# Patient Record
Sex: Male | Born: 1970 | Race: White | Hispanic: No | Marital: Married | Smoking: Never smoker
Health system: Southern US, Community
[De-identification: ages and names within clinical notes are randomized; demographics above are authoritative.]

## PROBLEM LIST (undated history)

## (undated) DIAGNOSIS — M1990 Unspecified osteoarthritis, unspecified site: Secondary | ICD-10-CM

## (undated) DIAGNOSIS — F419 Anxiety disorder, unspecified: Secondary | ICD-10-CM

## (undated) DIAGNOSIS — K219 Gastro-esophageal reflux disease without esophagitis: Secondary | ICD-10-CM

## (undated) DIAGNOSIS — Z9889 Other specified postprocedural states: Secondary | ICD-10-CM

## (undated) DIAGNOSIS — R112 Nausea with vomiting, unspecified: Secondary | ICD-10-CM

## (undated) DIAGNOSIS — E669 Obesity, unspecified: Secondary | ICD-10-CM

## (undated) HISTORY — PX: VASECTOMY: SHX75

---
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
## 1992-07-06 HISTORY — PX: CHOLECYSTECTOMY: SHX55

## 2013-11-29 NOTE — Progress Notes (Signed)
Please enter orders in EPIC as patient has pre-op appointment on 12/01/13 at 9 am! Thank you!

## 2013-11-30 ENCOUNTER — Other Ambulatory Visit: Payer: Self-pay | Admitting: Orthopedic Surgery

## 2013-11-30 ENCOUNTER — Encounter (HOSPITAL_COMMUNITY): Payer: Self-pay | Admitting: Pharmacy Technician

## 2013-11-30 ENCOUNTER — Other Ambulatory Visit (HOSPITAL_COMMUNITY): Payer: Self-pay | Admitting: Specialist

## 2013-11-30 NOTE — Patient Instructions (Addendum)
[place]  11/30/2013   Your procedure is scheduled on: Wednesday June 3rd, 2015  Report to [facility] Main Entrance and follow signs to  [place] at 900 AM.  Call this number if you have problems the morning of surgery [phone]   Remember:  Do not eat food or drink liquids :After Midnight.     Take these medicines the morning of surgery with A SIP OF WATER: lexapro                               You may not have any metal on your body including hair pins and piercings  Do not wear jewelry, make-up, lotions, powders, or deodorant.   Men may shave face and neck.  Do not bring valuables to the hospital. [place].  Contacts, dentures or bridgework may not be worn into surgery.  Leave suitcase in the car. After surgery it may be brought to your room.  For patients admitted to the hospital, checkout time is 11:00 AM the day of discharge. ____________________________________________  [facility] - Preparing for Surgery Before surgery, you can play an important role.  Because skin is not sterile, your skin needs to be as free of germs as possible.  You can reduce the number of germs on your skin by washing with CHG (chlorahexidine gluconate) soap before surgery.  CHG is an antiseptic cleaner which kills germs and bonds with the skin to continue killing germs even after washing. Please DO NOT use if you have an allergy to CHG or antibacterial soaps.  If your skin becomes reddened/irritated stop using the CHG and inform your nurse when you arrive at Short Stay. Do not shave (including legs and underarms) for at least 48 hours prior to the first CHG shower.  You may shave your face/neck. Please follow these instructions carefully:  1.  Shower with CHG Soap the night before surgery and the  morning of Surgery.  2.  If you choose to wash your hair, wash your hair first as usual with your  normal  shampoo.  3.  After you shampoo, rinse  your hair and body thoroughly to remove the  shampoo.                            4.  Use CHG as you would any other liquid soap.  You can apply chg directly  to the skin and wash                       Gently with a scrungie or clean washcloth.  5.  Apply the CHG Soap to your body ONLY FROM THE NECK DOWN.   Do not use on face/ open                           Wound or open sores. Avoid contact with eyes, ears mouth and genitals (private parts).                       Wash face,  Genitals (private parts) with your normal soap.             6.  Wash thoroughly, paying special attention to the area where your surgery  will be performed.  7.  Thoroughly rinse your  body with warm water from the neck down.  8.  DO NOT shower/wash with your normal soap after using and rinsing off  the CHG Soap.                9.  Pat yourself dry with a clean towel.            10.  Wear clean pajamas.            11.  Place clean sheets on your bed the night of your first shower and do not  sleep with pets. Day of Surgery : Do not apply any lotions/deodorants the morning of surgery.  Please wear clean clothes to the hospital/surgery center.  FAILURE TO FOLLOW THESE INSTRUCTIONS MAY RESULT IN THE CANCELLATION OF YOUR SURGERY PATIENT SIGNATURE_________________________________  NURSE SIGNATURE__________________________________  ________________________________________________________________________   Adam Phenix  An incentive spirometer is a tool that can help keep your lungs clear and active. This tool measures how well you are filling your lungs with each breath. Taking long deep breaths may help reverse or decrease the chance of developing breathing (pulmonary) problems (especially infection) following:  A long period of time when you are unable to move or be active. BEFORE THE PROCEDURE   If the spirometer includes an indicator to show your best effort, your nurse or respiratory therapist will set it to a  desired goal.  If possible, sit up straight or lean slightly forward. Try not to slouch.  Hold the incentive spirometer in an upright position. INSTRUCTIONS FOR USE  1. Sit on the edge of your bed if possible, or sit up as far as you can in bed or on a chair. 2. Hold the incentive spirometer in an upright position. 3. Breathe out normally. 4. Place the mouthpiece in your mouth and seal your lips tightly around it. 5. Breathe in slowly and as deeply as possible, raising the piston or the ball toward the top of the column. 6. Hold your breath for 3-5 seconds or for as long as possible. Allow the piston or ball to fall to the bottom of the column. 7. Remove the mouthpiece from your mouth and breathe out normally. 8. Rest for a few seconds and repeat Steps 1 through 7 at least 10 times every 1-2 hours when you are awake. Take your time and take a few normal breaths between deep breaths. 9. The spirometer may include an indicator to show your best effort. Use the indicator as a goal to work toward during each repetition. 10. After each set of 10 deep breaths, practice coughing to be sure your lungs are clear. If you have an incision (the cut made at the time of surgery), support your incision when coughing by placing a pillow or rolled up towels firmly against it. Once you are able to get out of bed, walk around indoors and cough well. You may stop using the incentive spirometer when instructed by your caregiver.  RISKS AND COMPLICATIONS  Take your time so you do not get dizzy or light-headed.  If you are in pain, you may need to take or ask for pain medication before doing incentive spirometry. It is harder to take a deep breath if you are having pain. AFTER USE  Rest and breathe slowly and easily.  It can be helpful to keep track of a log of your progress. Your caregiver can provide you with a simple table to help with this. If you are using the spirometer at home, follow  these  instructions: SEEK MEDICAL CARE IF:   You are having difficultly using the spirometer.  You have trouble using the spirometer as often as instructed.  Your pain medication is not giving enough relief while using the spirometer.  You develop fever of 100.5 F (38.1 C) or higher. SEEK IMMEDIATE MEDICAL CARE IF:   You cough up bloody sputum that had not been present before.  You develop fever of 102 F (38.9 C) or greater.  You develop worsening pain at or near the incision site. MAKE SURE YOU:   Understand these instructions.  Will watch your condition.  Will get help right away if you are not doing well or get worse. Document Released: 11/02/2006 Document Revised: 09/14/2011 Document Reviewed: 01/03/2007 [facility] Patient Information 2014 [place], [place].   ________________________________________________________________________

## 2013-12-01 ENCOUNTER — Ambulatory Visit (HOSPITAL_COMMUNITY)
Admission: RE | Admit: 2013-12-01 | Discharge: 2013-12-01 | Disposition: A | Discharge status: Home / Self Care | Payer: Managed Care, Other (non HMO) | Source: Ambulatory Visit | Attending: Orthopedic Surgery | Admitting: Orthopedic Surgery

## 2013-12-01 ENCOUNTER — Encounter (HOSPITAL_COMMUNITY)
Admission: RE | Admit: 2013-12-01 | Discharge: 2013-12-01 | Disposition: A | Discharge status: Home / Self Care | Payer: Managed Care, Other (non HMO) | Source: Ambulatory Visit | Attending: Specialist | Admitting: Specialist

## 2013-12-01 ENCOUNTER — Encounter (INDEPENDENT_AMBULATORY_CARE_PROVIDER_SITE_OTHER): Payer: Self-pay

## 2013-12-01 ENCOUNTER — Encounter (HOSPITAL_COMMUNITY): Payer: Self-pay

## 2013-12-01 DIAGNOSIS — Z01812 Encounter for preprocedural laboratory examination: Secondary | ICD-10-CM | POA: Insufficient documentation

## 2013-12-01 DIAGNOSIS — Z01818 Encounter for other preprocedural examination: Secondary | ICD-10-CM | POA: Insufficient documentation

## 2013-12-01 HISTORY — DX: Nausea with vomiting, unspecified: R11.2

## 2013-12-01 HISTORY — DX: Gastro-esophageal reflux disease without esophagitis: K21.9

## 2013-12-01 HISTORY — DX: Nausea with vomiting, unspecified: Z98.890

## 2013-12-01 HISTORY — DX: Anxiety disorder, unspecified: F41.9

## 2013-12-01 LAB — CBC
HEMATOCRIT: 48.9 % (ref 39.0–52.0)
HEMOGLOBIN: 16.7 g/dL (ref 13.0–17.0)
MCH: 31.7 pg (ref 26.0–34.0)
MCHC: 34.2 g/dL (ref 30.0–36.0)
MCV: 92.8 fL (ref 78.0–100.0)
Platelets: 249 10*3/uL (ref 150–400)
RBC: 5.27 MIL/uL (ref 4.22–5.81)
RDW: 12.8 % (ref 11.5–15.5)
WBC: 8.9 10*3/uL (ref 4.0–10.5)

## 2013-12-01 LAB — BASIC METABOLIC PANEL
BUN: 17 mg/dL (ref 6–23)
CO2: 28 mEq/L (ref 19–32)
Calcium: 9.6 mg/dL (ref 8.4–10.5)
Chloride: 101 mEq/L (ref 96–112)
Creatinine, Ser: 0.76 mg/dL (ref 0.50–1.35)
GFR calc non Af Amer: >90 mL/min (ref >90)
GLUCOSE: 82 mg/dL (ref 70–99)
POTASSIUM: 4.7 meq/L (ref 3.7–5.3)
SODIUM: 138 meq/L (ref 137–147)

## 2013-12-01 LAB — SURGICAL PCR SCREEN
MRSA, PCR: POSITIVE — AB
Staphylococcus aureus: POSITIVE — AB

## 2013-12-01 NOTE — Progress Notes (Signed)
Prescription for Mupirocin 22 gram tube called into the [facility] pharmacy in [place]. Pt called and made aware. Pt has no questions or concerns

## 2013-12-04 ENCOUNTER — Other Ambulatory Visit: Payer: Self-pay | Admitting: Orthopedic Surgery

## 2013-12-04 NOTE — H&P (Signed)
Darren Woods is an 43 y.o. male.   Chief Complaint: back and leg pain HPI: The patient is a 43 year old male who presents today for follow up of their back. The patient is being followed for their low back symptoms. They are now year(s) out from when symptoms initially began, worsening over the past 6 weeks. Symptoms reported today include: pain, weakness, numbness, burning and leg pain (right). The patient states that they are doing poorly. Current treatment includes: relative rest, activity modification and pain medications. The following medication has been used for pain control: Norco. The patient presents today following MRI. Note for "Follow-up back": The patient reports new symptoms of burning and pain in the saddle area. He states that he has not lost control of his bladder, but he does not feel the urge to urinate. The patient is currently out of work.  Right lower extremity radicular pain. Follows up with his MRI indicating disc herniation at L5-S1 compressing the S1 nerve root. He reports persistent numbness, weakness and pain into the leg not helped with the cortisone.  Past Medical History  Diagnosis Date  . Anxiety   . GERD (gastroesophageal reflux disease)     occasional  . PONV (postoperative nausea and vomiting)     with first knee surgery    Past Surgical History  Procedure Laterality Date  . Cholecystectomy  1994  . Knee arthroscopy Bilateral 1989    No family history on file. Social History:  reports that he has never smoked. He has never used smokeless tobacco. He reports that he drinks alcohol. He reports that he does not use illicit drugs.  Allergies: No Known Allergies   (Not in a hospital admission)  No results found for this or any previous visit (from the past 48 hour(s)). No results found.  Review of Systems  Constitutional: Negative.   HENT: Negative.   Eyes: Negative.   Respiratory: Negative.   Cardiovascular: Negative.   Gastrointestinal:  Negative.   Genitourinary: Negative.   Musculoskeletal: Positive for back pain.  Skin: Negative.   Neurological: Positive for sensory change and focal weakness.    There were no vitals taken for this visit. Physical Exam  Constitutional: He is oriented to person, place, and time. He appears well-developed and well-nourished.  HENT:  Head: Normocephalic and atraumatic.  Eyes: Conjunctivae and EOM are normal. Pupils are equal, round, and reactive to light.  Neck: Normal range of motion. Neck supple.  Cardiovascular: Normal rate and regular rhythm.   Respiratory: Effort normal and breath sounds normal.  GI: Soft. Bowel sounds are normal.  Musculoskeletal:  On exam he is in moderate distress. Mood and affect appropriate. He walks with an antalgic gait. Straight leg raise on the right produces buttock, thigh and calf pain exacerbated with dorsal augmentation maneuver. He has diminished repetitive plantar flexion, decreased sensation in the S1 dermatome, absent Achilles reflex. Pain with extension and end flexion of the lumbar spine. He may have some slight hip flexor weakness.  No evidence of soft tissue swelling, deformity or skin ecchymosis. On palpation there is no tenderness of the lumbar spine. No flank pain with percussion. The abdomen is soft and nontender. Nontender over the trochanters. No cellulitis or lymphadenopathy.  Motor is 5/5 including EHL, tibialis anterior, plantar flexion, quadriceps and hamstrings. Patient is normoreflexic. There is no Babinski or clonus. patient has good distal pulses. No DVT. No pain and normal range of motion without instability of the hips, knees and ankles.  Neurological: He is alert and oriented to person, place, and time. He has normal reflexes.  Skin: Skin is warm and dry.  Psychiatric: He has a normal mood and affect.    Imaging He does have a disc herniation paracentral to the right compressing the S1 nerve root. At L2-3 he has a significant  epidural fat that is compressive the thecal sac. It was read as moderate however the AP diameter of the thecal sac in the right lateral recess is actually 2.6 in millimeters and essentially the 3 mm which would be consistent with severe stenosis.  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 Clearly I feel with the myotomal weakness and dermatomal dysesthesias decompression of L5-S1 is appropriate.  I had an extensive discussion of the risks and benefits of the lumbar decompression with the patient including bleeding, infection, damage to neurovascular structures, epidural fibrosis, CSF leak requiring repair. We also discussed increase in pain, adjacent segment disease, recurrent disc herniation, need for future surgery including repeat decompression and/or fusion. We also discussed risks of postoperative hematoma, paralysis, anesthetic complications including DVT, PE, death, cardiopulmonary dysfunction. In addition, the perioperative and postoperative courses were discussed in detail including the rehabilitative time and return to functional activity and work. I provided the patient with an illustrated handout and utilized the appropriate surgical models.  Hopefully we will get resolution of his symptoms. We discussed permanent symptoms, recurrent disc herniation, progressive disc degeneration. He does have intermittent back pain from his disc degeneration at L2-3, L3-4 and L4-5 that he will have ongoing back pain. We discussed the possibility of decompressing at L3-4. I do feel this is stenosis that is fairly significant. He reports just prior to the event of this he would have some problems where he would be ambulating at a track or out and his legs will get weak down the front of his thigh. It has been progressive over the years. This would be symptoms separate from his acute disc herniation. I reviewed the MRI findings. 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 Given that he has had symptoms that are claudication type symptoms in the presence of that lesion it would be reasonable to decompress L2-3 centrally given that symptomatology. We will have him scheduled for that procedure.  Plan microlumbar decompression L2-3, L5-S1  Anylah Scheib M. Asante Blanda for Dr. Tonita Cong 12/04/2013, 8:34 AM

## 2013-12-05 MED ORDER — VANCOMYCIN HCL 10 G IV SOLR
1500.0000 mg | INTRAVENOUS | Status: AC
  Administered 2013-12-06: 1500 mg via INTRAVENOUS
  Filled 2013-12-05 (×2): qty 1500

## 2013-12-05 MED ORDER — DEXTROSE 5 % IV SOLN
3.0000 g | INTRAVENOUS | Status: AC
  Administered 2013-12-06: 3 g via INTRAVENOUS
  Filled 2013-12-05: qty 3000

## 2013-12-06 ENCOUNTER — Ambulatory Visit (HOSPITAL_COMMUNITY): Payer: Managed Care, Other (non HMO)

## 2013-12-06 ENCOUNTER — Encounter (HOSPITAL_COMMUNITY): Payer: Managed Care, Other (non HMO) | Admitting: Registered Nurse

## 2013-12-06 ENCOUNTER — Ambulatory Visit (HOSPITAL_COMMUNITY)
Admission: RE | Admit: 2013-12-06 | Discharge: 2013-12-07 | Disposition: A | Discharge status: Home / Self Care | Payer: Managed Care, Other (non HMO) | Source: Ambulatory Visit | Attending: Specialist | Admitting: Specialist

## 2013-12-06 ENCOUNTER — Encounter (HOSPITAL_COMMUNITY)
Admission: RE | Disposition: A | Discharge status: Home / Self Care | Payer: Self-pay | Source: Ambulatory Visit | Attending: Specialist

## 2013-12-06 ENCOUNTER — Ambulatory Visit (HOSPITAL_COMMUNITY): Payer: Managed Care, Other (non HMO) | Admitting: Registered Nurse

## 2013-12-06 ENCOUNTER — Encounter (HOSPITAL_COMMUNITY): Payer: Self-pay | Admitting: *Deleted

## 2013-12-06 DIAGNOSIS — D1779 Benign lipomatous neoplasm of other sites: Secondary | ICD-10-CM | POA: Insufficient documentation

## 2013-12-06 DIAGNOSIS — Z6838 Body mass index (BMI) 38.0-38.9, adult: Secondary | ICD-10-CM | POA: Insufficient documentation

## 2013-12-06 DIAGNOSIS — M5126 Other intervertebral disc displacement, lumbar region: Secondary | ICD-10-CM | POA: Insufficient documentation

## 2013-12-06 DIAGNOSIS — Z9089 Acquired absence of other organs: Secondary | ICD-10-CM | POA: Insufficient documentation

## 2013-12-06 DIAGNOSIS — K219 Gastro-esophageal reflux disease without esophagitis: Secondary | ICD-10-CM | POA: Insufficient documentation

## 2013-12-06 DIAGNOSIS — M48061 Spinal stenosis, lumbar region without neurogenic claudication: Secondary | ICD-10-CM | POA: Diagnosis present

## 2013-12-06 HISTORY — PX: LUMBAR LAMINECTOMY/DECOMPRESSION MICRODISCECTOMY: SHX5026

## 2013-12-06 SURGERY — LUMBAR LAMINECTOMY/DECOMPRESSION MICRODISCECTOMY 2 LEVELS
Anesthesia: General | Site: Back

## 2013-12-06 MED ORDER — DOCUSATE SODIUM 100 MG PO CAPS: 100.0000 mg | ORAL_CAPSULE | Freq: Two times a day (BID) | ORAL | Status: DC

## 2013-12-06 MED ORDER — DEXAMETHASONE SODIUM PHOSPHATE 10 MG/ML IJ SOLN
INTRAMUSCULAR | Status: DC | PRN
Start: 2013-12-06 — End: 2013-12-06
  Administered 2013-12-06: 10 mg via INTRAVENOUS

## 2013-12-06 MED ORDER — FENTANYL CITRATE 0.05 MG/ML IJ SOLN
INTRAMUSCULAR | Status: AC
  Filled 2013-12-06: qty 5

## 2013-12-06 MED ORDER — SCOPOLAMINE 1 MG/3DAYS TD PT72
MEDICATED_PATCH | TRANSDERMAL | Status: DC | PRN
  Administered 2013-12-06: 1 via TRANSDERMAL

## 2013-12-06 MED ORDER — ONDANSETRON HCL 4 MG/2ML IJ SOLN
INTRAMUSCULAR | Status: DC | PRN
  Administered 2013-12-06: 4 mg via INTRAVENOUS

## 2013-12-06 MED ORDER — NEOSTIGMINE METHYLSULFATE 10 MG/10ML IV SOLN
INTRAVENOUS | Status: AC
  Filled 2013-12-06: qty 1

## 2013-12-06 MED ORDER — OXYCODONE-ACETAMINOPHEN 5-325 MG PO TABS
1.0000 | ORAL_TABLET | ORAL | Status: DC | PRN
  Administered 2013-12-06 – 2013-12-07 (×5): 2 via ORAL
  Filled 2013-12-06 (×5): qty 2

## 2013-12-06 MED ORDER — ESCITALOPRAM OXALATE 10 MG PO TABS
10.0000 mg | ORAL_TABLET | Freq: Every morning | ORAL | Status: DC
  Administered 2013-12-07: 10 mg via ORAL
  Filled 2013-12-06 (×2): qty 1

## 2013-12-06 MED ORDER — ADULT MULTIVITAMIN W/MINERALS CH
1.0000 | ORAL_TABLET | Freq: Every day | ORAL | Status: DC
  Administered 2013-12-06 – 2013-12-07 (×2): 1 via ORAL
  Filled 2013-12-06 (×2): qty 1

## 2013-12-06 MED ORDER — ACETAMINOPHEN 650 MG RE SUPP: 650.0000 mg | RECTAL | Status: DC | PRN

## 2013-12-06 MED ORDER — SCOPOLAMINE 1 MG/3DAYS TD PT72
MEDICATED_PATCH | TRANSDERMAL | Status: AC
  Filled 2013-12-06: qty 1

## 2013-12-06 MED ORDER — THROMBIN 5000 UNITS EX SOLR
CUTANEOUS | Status: DC | PRN
  Administered 2013-12-06: 10000 [IU] via TOPICAL

## 2013-12-06 MED ORDER — SODIUM CHLORIDE 0.9 % IR SOLN
Status: DC | PRN
  Administered 2013-12-06: 12:00:00

## 2013-12-06 MED ORDER — DEXTROSE 5 % IV SOLN
3.0000 g | Freq: Three times a day (TID) | INTRAVENOUS | Status: AC
  Administered 2013-12-06 – 2013-12-07 (×2): 3 g via INTRAVENOUS
  Filled 2013-12-06 (×2): qty 3000

## 2013-12-06 MED ORDER — THROMBIN 5000 UNITS EX SOLR
CUTANEOUS | Status: AC
  Filled 2013-12-06: qty 10000

## 2013-12-06 MED ORDER — HYDROCODONE-ACETAMINOPHEN 5-325 MG PO TABS: 1.0000 | ORAL_TABLET | ORAL | Status: DC | PRN

## 2013-12-06 MED ORDER — ONDANSETRON HCL 4 MG/2ML IJ SOLN: 4.0000 mg | INTRAMUSCULAR | Status: DC | PRN

## 2013-12-06 MED ORDER — HYDROMORPHONE HCL PF 1 MG/ML IJ SOLN
0.5000 mg | INTRAMUSCULAR | Status: DC | PRN
  Administered 2013-12-06 (×2): 1 mg via INTRAVENOUS
  Filled 2013-12-06 (×2): qty 1

## 2013-12-06 MED ORDER — PROMETHAZINE HCL 25 MG/ML IJ SOLN: 6.2500 mg | INTRAMUSCULAR | Status: DC | PRN

## 2013-12-06 MED ORDER — ROCURONIUM BROMIDE 100 MG/10ML IV SOLN
INTRAVENOUS | Status: AC
  Filled 2013-12-06: qty 1

## 2013-12-06 MED ORDER — MIDAZOLAM HCL 5 MG/5ML IJ SOLN
INTRAMUSCULAR | Status: DC | PRN
  Administered 2013-12-06: 2 mg via INTRAVENOUS

## 2013-12-06 MED ORDER — OXYCODONE-ACETAMINOPHEN 5-325 MG PO TABS: 1.0000 | ORAL_TABLET | ORAL | Status: DC | PRN

## 2013-12-06 MED ORDER — LIDOCAINE HCL (CARDIAC) 20 MG/ML IV SOLN
INTRAVENOUS | Status: AC
  Filled 2013-12-06: qty 5

## 2013-12-06 MED ORDER — GLYCOPYRROLATE 0.2 MG/ML IJ SOLN
INTRAMUSCULAR | Status: AC
  Filled 2013-12-06: qty 1

## 2013-12-06 MED ORDER — OXYCODONE HCL 5 MG/5ML PO SOLN
5.0000 mg | Freq: Once | ORAL | Status: DC | PRN
  Filled 2013-12-06: qty 5

## 2013-12-06 MED ORDER — HYDROMORPHONE HCL PF 1 MG/ML IJ SOLN
0.2500 mg | INTRAMUSCULAR | Status: DC | PRN
  Administered 2013-12-06 (×4): 0.5 mg via INTRAVENOUS

## 2013-12-06 MED ORDER — PHENYLEPHRINE HCL 10 MG/ML IJ SOLN
INTRAMUSCULAR | Status: DC | PRN
  Administered 2013-12-06: 5 ug via INTRAVENOUS
  Administered 2013-12-06 (×3): 120 ug via INTRAVENOUS

## 2013-12-06 MED ORDER — METHOCARBAMOL 500 MG PO TABS: 500.0000 mg | ORAL_TABLET | Freq: Four times a day (QID) | ORAL | Status: DC | PRN

## 2013-12-06 MED ORDER — GLYCOPYRROLATE 0.2 MG/ML IJ SOLN
INTRAMUSCULAR | Status: DC | PRN
  Administered 2013-12-06: 0.6 mg via INTRAVENOUS

## 2013-12-06 MED ORDER — SODIUM CHLORIDE 0.45 % IV SOLN
INTRAVENOUS | Status: DC
  Administered 2013-12-06 – 2013-12-07 (×2): via INTRAVENOUS

## 2013-12-06 MED ORDER — HYDROMORPHONE HCL PF 1 MG/ML IJ SOLN
INTRAMUSCULAR | Status: AC
  Filled 2013-12-06: qty 1

## 2013-12-06 MED ORDER — PROPOFOL 10 MG/ML IV BOLUS
INTRAVENOUS | Status: DC | PRN
  Administered 2013-12-06: 200 mg via INTRAVENOUS
  Administered 2013-12-06: 100 mg via INTRAVENOUS

## 2013-12-06 MED ORDER — FENTANYL CITRATE 0.05 MG/ML IJ SOLN
INTRAMUSCULAR | Status: DC | PRN
  Administered 2013-12-06: 100 ug via INTRAVENOUS
  Administered 2013-12-06: 150 ug via INTRAVENOUS

## 2013-12-06 MED ORDER — LACTATED RINGERS IV SOLN
INTRAVENOUS | Status: DC
  Administered 2013-12-06 (×2): via INTRAVENOUS

## 2013-12-06 MED ORDER — SODIUM CHLORIDE 0.9 % IJ SOLN: 3.0000 mL | Freq: Two times a day (BID) | INTRAMUSCULAR | Status: DC

## 2013-12-06 MED ORDER — SODIUM CHLORIDE 0.9 % IJ SOLN: 3.0000 mL | INTRAMUSCULAR | Status: DC | PRN

## 2013-12-06 MED ORDER — PROPOFOL 10 MG/ML IV BOLUS
INTRAVENOUS | Status: AC
  Filled 2013-12-06: qty 20

## 2013-12-06 MED ORDER — DOCUSATE SODIUM 100 MG PO CAPS
100.0000 mg | ORAL_CAPSULE | Freq: Two times a day (BID) | ORAL | Status: DC
  Administered 2013-12-06 – 2013-12-07 (×2): 100 mg via ORAL

## 2013-12-06 MED ORDER — PHENOL 1.4 % MT LIQD: 1.0000 | OROMUCOSAL | Status: DC | PRN

## 2013-12-06 MED ORDER — NEOSTIGMINE METHYLSULFATE 10 MG/10ML IV SOLN
INTRAVENOUS | Status: DC | PRN
  Administered 2013-12-06: 4 mg via INTRAVENOUS

## 2013-12-06 MED ORDER — ROCURONIUM BROMIDE 100 MG/10ML IV SOLN
INTRAVENOUS | Status: DC | PRN
Start: 2013-12-06 — End: 2013-12-06
  Administered 2013-12-06: 60 mg via INTRAVENOUS
  Administered 2013-12-06 (×2): 10 mg via INTRAVENOUS

## 2013-12-06 MED ORDER — MEPERIDINE HCL 50 MG/ML IJ SOLN: 6.2500 mg | INTRAMUSCULAR | Status: DC | PRN

## 2013-12-06 MED ORDER — ACETAMINOPHEN 10 MG/ML IV SOLN
1000.0000 mg | Freq: Once | INTRAVENOUS | Status: AC
  Administered 2013-12-06: 1000 mg via INTRAVENOUS
  Filled 2013-12-06: qty 100

## 2013-12-06 MED ORDER — ONDANSETRON HCL 4 MG/2ML IJ SOLN
INTRAMUSCULAR | Status: AC
  Filled 2013-12-06: qty 2

## 2013-12-06 MED ORDER — MIDAZOLAM HCL 2 MG/2ML IJ SOLN
INTRAMUSCULAR | Status: AC
  Filled 2013-12-06: qty 2

## 2013-12-06 MED ORDER — DEXAMETHASONE SODIUM PHOSPHATE 10 MG/ML IJ SOLN
INTRAMUSCULAR | Status: AC
  Filled 2013-12-06: qty 1

## 2013-12-06 MED ORDER — LIDOCAINE HCL (CARDIAC) 20 MG/ML IV SOLN
INTRAVENOUS | Status: DC | PRN
  Administered 2013-12-06 (×2): 50 mg via INTRAVENOUS

## 2013-12-06 MED ORDER — BUPIVACAINE-EPINEPHRINE (PF) 0.5% -1:200000 IJ SOLN
INTRAMUSCULAR | Status: AC
  Filled 2013-12-06: qty 30

## 2013-12-06 MED ORDER — ACETAMINOPHEN 325 MG PO TABS: 650.0000 mg | ORAL_TABLET | ORAL | Status: DC | PRN

## 2013-12-06 MED ORDER — MENTHOL 3 MG MT LOZG
1.0000 | LOZENGE | OROMUCOSAL | Status: DC | PRN
  Administered 2013-12-07: 3 mg via ORAL
  Filled 2013-12-06: qty 9

## 2013-12-06 MED ORDER — EPHEDRINE SULFATE 50 MG/ML IJ SOLN
INTRAMUSCULAR | Status: DC | PRN
  Administered 2013-12-06 (×2): 10 mg via INTRAVENOUS

## 2013-12-06 MED ORDER — BUPIVACAINE-EPINEPHRINE (PF) 0.5% -1:200000 IJ SOLN
INTRAMUSCULAR | Status: DC | PRN
  Administered 2013-12-06: 16 mL

## 2013-12-06 MED ORDER — SODIUM CHLORIDE 0.9 % IV SOLN: 250.0000 mL | INTRAVENOUS | Status: DC

## 2013-12-06 MED ORDER — OXYCODONE HCL 5 MG PO TABS: 5.0000 mg | ORAL_TABLET | Freq: Once | ORAL | Status: DC | PRN

## 2013-12-06 SURGICAL SUPPLY — 48 items
BAG ZIPLOCK 12X15 (MISCELLANEOUS) ×3 IMPLANT
BENZOIN TINCTURE PRP APPL 2/3 (GAUZE/BANDAGES/DRESSINGS) IMPLANT
CHLORAPREP W/TINT 26ML (MISCELLANEOUS) IMPLANT
CLEANER TIP ELECTROSURG 2X2 (MISCELLANEOUS) ×3 IMPLANT
CLOSURE WOUND 1/2 X4 (GAUZE/BANDAGES/DRESSINGS)
CLOTH 2% CHLOROHEXIDINE 3PK (PERSONAL CARE ITEMS) ×3 IMPLANT
DECANTER SPIKE VIAL GLASS SM (MISCELLANEOUS) ×3 IMPLANT
DRAPE MICROSCOPE LEICA (MISCELLANEOUS) ×3 IMPLANT
DRAPE POUCH INSTRU U-SHP 10X18 (DRAPES) ×3 IMPLANT
DRAPE SURG 17X11 SM STRL (DRAPES) ×3 IMPLANT
DRAPE UTILITY XL STRL (DRAPES) ×3 IMPLANT
DRESSING TELFA ISLAND 4X8 (GAUZE/BANDAGES/DRESSINGS) ×3 IMPLANT
DRSG AQUACEL AG ADV 3.5X 4 (GAUZE/BANDAGES/DRESSINGS) ×3 IMPLANT
DRSG AQUACEL AG ADV 3.5X 6 (GAUZE/BANDAGES/DRESSINGS) ×3 IMPLANT
DURAPREP 26ML APPLICATOR (WOUND CARE) ×3 IMPLANT
DURASEAL SPINE SEALANT 3ML (MISCELLANEOUS) IMPLANT
ELECT REM PT RETURN 9FT ADLT (ELECTROSURGICAL) ×3
ELECTRODE REM PT RTRN 9FT ADLT (ELECTROSURGICAL) ×1 IMPLANT
GLOVE BIOGEL PI IND STRL 7.5 (GLOVE) ×1 IMPLANT
GLOVE BIOGEL PI INDICATOR 7.5 (GLOVE) ×2
GLOVE SURG SS PI 7.5 STRL IVOR (GLOVE) ×3 IMPLANT
GLOVE SURG SS PI 8.0 STRL IVOR (GLOVE) ×6 IMPLANT
GOWN STRL REUS W/TWL XL LVL3 (GOWN DISPOSABLE) ×6 IMPLANT
IV CATH 14GX2 1/4 (CATHETERS) ×3 IMPLANT
KIT BASIN OR (CUSTOM PROCEDURE TRAY) ×3 IMPLANT
KIT POSITIONING SURG ANDREWS (MISCELLANEOUS) ×3 IMPLANT
MANIFOLD NEPTUNE II (INSTRUMENTS) ×3 IMPLANT
NEEDLE SPNL 18GX3.5 QUINCKE PK (NEEDLE) ×12 IMPLANT
PATTIES SURGICAL .5 X.5 (GAUZE/BANDAGES/DRESSINGS) IMPLANT
PATTIES SURGICAL .75X.75 (GAUZE/BANDAGES/DRESSINGS) IMPLANT
PATTIES SURGICAL 1X1 (DISPOSABLE) IMPLANT
SPONGE SURGIFOAM ABS GEL 100 (HEMOSTASIS) ×3 IMPLANT
STAPLER VISISTAT (STAPLE) ×3 IMPLANT
STRIP CLOSURE SKIN 1/2X4 (GAUZE/BANDAGES/DRESSINGS) IMPLANT
SUT NURALON 4 0 TR CR/8 (SUTURE) ×3 IMPLANT
SUT PROLENE 3 0 PS 2 (SUTURE) ×3 IMPLANT
SUT VIC AB 1 CT1 27 (SUTURE)
SUT VIC AB 1 CT1 27XBRD ANTBC (SUTURE) IMPLANT
SUT VIC AB 1-0 CT2 27 (SUTURE) IMPLANT
SUT VIC AB 2-0 CT1 27 (SUTURE)
SUT VIC AB 2-0 CT1 TAPERPNT 27 (SUTURE) IMPLANT
SUT VIC AB 2-0 CT2 27 (SUTURE) IMPLANT
SYRINGE 10CC LL (SYRINGE) ×3 IMPLANT
TOWEL OR 17X26 10 PK STRL BLUE (TOWEL DISPOSABLE) ×3 IMPLANT
TOWEL OR NON WOVEN STRL DISP B (DISPOSABLE) ×3 IMPLANT
TRAY FOLEY CATH 16FRSI W/METER (SET/KITS/TRAYS/PACK) ×3 IMPLANT
TRAY LAMINECTOMY (CUSTOM PROCEDURE TRAY) ×3 IMPLANT
YANKAUER SUCT BULB TIP NO VENT (SUCTIONS) ×3 IMPLANT

## 2013-12-06 NOTE — Discharge Instructions (Signed)
Walk As Tolerated utilizing back precautions.  No bending, twisting, or lifting.  No driving for 2 weeks.   °Aquacel dressing may remain in place for 7 days. May shower with aquacel dressing in place. After 7 days, remove aquacel dressing and place gauze and tape dressing which should be kept clean and dry and changed daily. °See Dr. Beane in office in 10 to 14 days. Begin taking aspirin 81mg per day starting 4 days after your surgery if not allergic to aspirin or on another blood thinner. °Walk daily even outside. Use a cane or walker only if necessary. °Avoid sitting on soft sofas. ° °

## 2013-12-06 NOTE — Transfer of Care (Signed)
Immediate Anesthesia Transfer of Care Note  Patient: Darren Woods  Procedure(s) Performed: Procedure(s): Decompression L5-S1,L2-L3, removal of lipoma at L2-L3, microdiscectomy at S1  (N/A)  Patient Location: PACU  Anesthesia Type:General  Level of Consciousness: awake, alert , oriented and patient cooperative  Airway & Oxygen Therapy: Patient Spontanous Breathing and Patient connected to face mask oxygen  Post-op Assessment: Report given to PACU RN, Post -op Vital signs reviewed and stable and Patient moving all extremities X 4  Post vital signs: stable  Complications: No apparent anesthesia complications

## 2013-12-06 NOTE — Brief Op Note (Signed)
12/06/2013  1:01 PM  PATIENT:  Darren Woods  43 y.o. male  PRE-OPERATIVE DIAGNOSIS:   HNP AND STENOSIS L5-S1,   L2-L3   POST-OPERATIVE DIAGNOSIS:   HNP AND STENOSIS L5-S1,   L2-L3   PROCEDURE:  Procedure(s): Decompression L5-S1,L2-L3, removal of lipoma at L2-L3, microdiscectomy at S1  (N/A)  SURGEON:  Surgeon(s) and Role:    * [name], MD - Primary  PHYSICIAN ASSISTANT:   ASSISTANTS: Bissell   ANESTHESIA:   general  EBL:  Total I/O In: 1000 [I.V.:1000] Out: 150 [Blood:150]  BLOOD ADMINISTERED:none  DRAINS: none   LOCAL MEDICATIONS USED:  MARCAINE     SPECIMEN:  Source of Specimen:  L23, L5S1  DISPOSITION OF SPECIMEN:  PATHOLOGY  COUNTS:  YES  TOURNIQUET:  * No tourniquets in log *  DICTATION: .Other Dictation: Dictation Number  [id number]  PLAN OF CARE: Admit for overnight observation  PATIENT DISPOSITION:  PACU - hemodynamically stable.   Delay start of Pharmacological VTE agent (>24hrs) due to surgical blood loss or risk of bleeding: yes

## 2013-12-06 NOTE — H&P (View-Only) (Signed)
Darren Woods is an 43 y.o. male.   Chief Complaint: back and leg pain HPI: The patient is a 43 year old male who presents today for follow up of their back. The patient is being followed for their low back symptoms. They are now year(s) out from when symptoms initially began, worsening over the past 6 weeks. Symptoms reported today include: pain, weakness, numbness, burning and leg pain (right). The patient states that they are doing poorly. Current treatment includes: relative rest, activity modification and pain medications. The following medication has been used for pain control: Norco. The patient presents today following MRI. Note for "Follow-up back": The patient reports new symptoms of burning and pain in the saddle area. He states that he has not lost control of his bladder, but he does not feel the urge to urinate. The patient is currently out of work.  Right lower extremity radicular pain. Follows up with his MRI indicating disc herniation at L5-S1 compressing the S1 nerve root. He reports persistent numbness, weakness and pain into the leg not helped with the cortisone.  Past Medical History  Diagnosis Date  . Anxiety   . GERD (gastroesophageal reflux disease)     occasional  . PONV (postoperative nausea and vomiting)     with first knee surgery    Past Surgical History  Procedure Laterality Date  . Cholecystectomy  1994  . Knee arthroscopy Bilateral 1989    No family history on file. Social History:  reports that he has never smoked. He has never used smokeless tobacco. He reports that he drinks alcohol. He reports that he does not use illicit drugs.  Allergies: No Known Allergies   (Not in a hospital admission)  No results found for this or any previous visit (from the past 48 hour(s)). No results found.  Review of Systems  Constitutional: Negative.   HENT: Negative.   Eyes: Negative.   Respiratory: Negative.   Cardiovascular: Negative.   Gastrointestinal:  Negative.   Genitourinary: Negative.   Musculoskeletal: Positive for back pain.  Skin: Negative.   Neurological: Positive for sensory change and focal weakness.    There were no vitals taken for this visit. Physical Exam  Constitutional: He is oriented to person, place, and time. He appears well-developed and well-nourished.  HENT:  Head: Normocephalic and atraumatic.  Eyes: Conjunctivae and EOM are normal. Pupils are equal, round, and reactive to light.  Neck: Normal range of motion. Neck supple.  Cardiovascular: Normal rate and regular rhythm.   Respiratory: Effort normal and breath sounds normal.  GI: Soft. Bowel sounds are normal.  Musculoskeletal:  On exam he is in moderate distress. Mood and affect appropriate. He walks with an antalgic gait. Straight leg raise on the right produces buttock, thigh and calf pain exacerbated with dorsal augmentation maneuver. He has diminished repetitive plantar flexion, decreased sensation in the S1 dermatome, absent Achilles reflex. Pain with extension and end flexion of the lumbar spine. He may have some slight hip flexor weakness.  No evidence of soft tissue swelling, deformity or skin ecchymosis. On palpation there is no tenderness of the lumbar spine. No flank pain with percussion. The abdomen is soft and nontender. Nontender over the trochanters. No cellulitis or lymphadenopathy.  Motor is 5/5 including EHL, tibialis anterior, plantar flexion, quadriceps and hamstrings. Patient is normoreflexic. There is no Babinski or clonus. patient has good distal pulses. No DVT. No pain and normal range of motion without instability of the hips, knees and ankles.  Neurological: He is alert and oriented to person, place, and time. He has normal reflexes.  Skin: Skin is warm and dry.  Psychiatric: He has a normal mood and affect.    Imaging He does have a disc herniation paracentral to the right compressing the S1 nerve root. At L2-3 he has a significant  epidural fat that is compressive the thecal sac. It was read as moderate however the AP diameter of the thecal sac in the right lateral recess is actually 2.6 in millimeters and essentially the 3 mm which would be consistent with severe stenosis.  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 Clearly I feel with the myotomal weakness and dermatomal dysesthesias decompression of L5-S1 is appropriate.  I had an extensive discussion of the risks and benefits of the lumbar decompression with the patient including bleeding, infection, damage to neurovascular structures, epidural fibrosis, CSF leak requiring repair. We also discussed increase in pain, adjacent segment disease, recurrent disc herniation, need for future surgery including repeat decompression and/or fusion. We also discussed risks of postoperative hematoma, paralysis, anesthetic complications including DVT, PE, death, cardiopulmonary dysfunction. In addition, the perioperative and postoperative courses were discussed in detail including the rehabilitative time and return to functional activity and work. I provided the patient with an illustrated handout and utilized the appropriate surgical models.  Hopefully we will get resolution of his symptoms. We discussed permanent symptoms, recurrent disc herniation, progressive disc degeneration. He does have intermittent back pain from his disc degeneration at L2-3, L3-4 and L4-5 that he will have ongoing back pain. We discussed the possibility of decompressing at L3-4. I do feel this is stenosis that is fairly significant. He reports just prior to the event of this he would have some problems where he would be ambulating at a track or out and his legs will get weak down the front of his thigh. It has been progressive over the years. This would be symptoms separate from his acute disc herniation. I reviewed the MRI findings. 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 Given that he has had symptoms that are claudication type symptoms in the presence of that lesion it would be reasonable to decompress L2-3 centrally given that symptomatology. We will have him scheduled for that procedure.  Plan microlumbar decompression L2-3, L5-S1  Vidyuth Belsito M. Payton Prinsen for Dr. Tonita Cong 12/04/2013, 8:34 AM

## 2013-12-06 NOTE — Anesthesia Preprocedure Evaluation (Signed)
Anesthesia Evaluation  Patient identified by MRN, date of birth, ID band Patient awake    Reviewed: Allergy & Precautions, H&P , NPO status , Patient's Chart, lab work & pertinent test results  History of Anesthesia Complications (+) PONV and history of anesthetic complications  Airway Mallampati: II TM Distance: >3 FB Neck ROM: Full    Dental  (+) Dental Advisory Given   Pulmonary neg pulmonary ROS,  breath sounds clear to auscultation        Cardiovascular negative cardio ROS  Rhythm:Regular Rate:Normal     Neuro/Psych PSYCHIATRIC DISORDERS Anxiety negative neurological ROS     GI/Hepatic Neg liver ROS, GERD-  ,  Endo/Other    Renal/GU negative Renal ROS     Musculoskeletal negative musculoskeletal ROS (+)   Abdominal   Peds  Hematology negative hematology ROS (+)   Anesthesia Other Findings   Reproductive/Obstetrics negative OB ROS                           Anesthesia Physical Anesthesia Plan  ASA: II  Anesthesia Plan: General   Post-op Pain Management:    Induction: Intravenous  Airway Management Planned: Oral ETT  Additional Equipment:   Intra-op Plan:   Post-operative Plan: Extubation in OR  Informed Consent: I have reviewed the patients History and Physical, chart, labs and discussed the procedure including the risks, benefits and alternatives for the proposed anesthesia with the patient or authorized representative who has indicated his/her understanding and acceptance.   Dental advisory given  Plan Discussed with: CRNA  Anesthesia Plan Comments:         Anesthesia Quick Evaluation

## 2013-12-06 NOTE — Interval H&P Note (Signed)
History and Physical Interval Note:  12/06/2013 8:21 AM  Darren Woods  has presented today for surgery, with the diagnosis of  HNP AND STENOSIS L5-S1,   L2-L3   The various methods of treatment have been discussed with the patient and family. After consideration of risks, benefits and other options for treatment, the patient has consented to  Procedure(s):  MICRODISCECTOMY LUMBAR DECOMPRESSION L5-S1 , L2-L3   (2 LEVELS)  (N/A) as a surgical intervention .  The patient's history has been reviewed, patient examined, no change in status, stable for surgery.  I have reviewed the patient's chart and labs.  Questions were answered to the patient's satisfaction.     [name]

## 2013-12-06 NOTE — Anesthesia Postprocedure Evaluation (Signed)
Anesthesia Post Note  Patient: Darren Woods  Procedure(s) Performed: Procedure(s) (LRB): Decompression L5-S1,L2-L3, removal of lipoma at L2-L3, microdiscectomy at S1  (N/A)  Anesthesia type: General  Patient location: PACU  Post pain: Pain level controlled  Post assessment: Post-op Vital signs reviewed  Last Vitals:  Filed Vitals:   12/06/13 1600  BP: 111/56  Pulse: 89  Temp: 36.4 C  Resp: 16    Post vital signs: Reviewed  Level of consciousness: sedated  Complications: No apparent anesthesia complications

## 2013-12-07 ENCOUNTER — Encounter (HOSPITAL_COMMUNITY): Payer: Self-pay | Admitting: Specialist

## 2013-12-07 LAB — BASIC METABOLIC PANEL
BUN: 11 mg/dL (ref 6–23)
CHLORIDE: 103 meq/L (ref 96–112)
CO2: 27 meq/L (ref 19–32)
Calcium: 8.6 mg/dL (ref 8.4–10.5)
Creatinine, Ser: 0.68 mg/dL (ref 0.50–1.35)
GFR calc Af Amer: >90 mL/min (ref >90)
Glucose, Bld: 137 mg/dL — ABNORMAL HIGH (ref 70–99)
Potassium: 4.2 mEq/L (ref 3.7–5.3)
SODIUM: 138 meq/L (ref 137–147)

## 2013-12-07 LAB — CBC
HCT: 42 % (ref 39.0–52.0)
HEMOGLOBIN: 14.3 g/dL (ref 13.0–17.0)
MCH: 31.5 pg (ref 26.0–34.0)
MCHC: 34 g/dL (ref 30.0–36.0)
MCV: 92.5 fL (ref 78.0–100.0)
Platelets: 239 10*3/uL (ref 150–400)
RBC: 4.54 MIL/uL (ref 4.22–5.81)
RDW: 12.6 % (ref 11.5–15.5)
WBC: 14.3 10*3/uL — ABNORMAL HIGH (ref 4.0–10.5)

## 2013-12-07 NOTE — Discharge Summary (Signed)
Physician Discharge Summary   Patient ID: Darren Woods MRN: [id number] DOB/AGE: Dec 01, 1970 43 y.o.  Admit date: 12/06/2013 Discharge date: 12/07/2013  Primary Diagnosis:    HNP AND STENOSIS L5-S1,   L2-L3   Admission Diagnoses:  Past Medical History  Diagnosis Date  . Anxiety   . GERD (gastroesophageal reflux disease)     occasional  . PONV (postoperative nausea and vomiting)     with first knee surgery   Discharge Diagnoses:   Active Problems:   Spinal stenosis of lumbar region at multiple levels  Procedure:  Procedure(s) (LRB): Decompression L5-S1,L2-L3, removal of lipoma at L2-L3, microdiscectomy at S1  (N/A)   Consults: None  HPI:  see H&P    Laboratory Data: Hospital Outpatient Visit on 12/01/2013  Component Date Value Ref Range Status  . MRSA, PCR 12/01/2013 POSITIVE* NEGATIVE Final   Comment: RESULT CALLED TO, READ BACK BY AND VERIFIED WITH:                          Franz Dell RN AQT 1250 ON 05.29.15 BY SHUEA  . Staphylococcus aureus 12/01/2013 POSITIVE* NEGATIVE Final   Comment:                                 The Xpert SA Assay (FDA                          approved for NASAL specimens                          in patients over 51 years of age),                          is one component of                          a comprehensive surveillance                          program.  Test performance has                          been validated by [employer] for patients greater                          than or equal to 68 year old.                          It is not intended                          to diagnose infection nor to                          guide or monitor treatment.  . Sodium 12/01/2013 138  137 - 147 mEq/L Final  . Potassium 12/01/2013 4.7  3.7 - 5.3 mEq/L Final  . Chloride 12/01/2013 101  96 - 112 mEq/L Final  .  CO2 12/01/2013 28  19 - 32 mEq/L Final  . Glucose, Bld 12/01/2013 82  70 - 99 mg/dL Final  . BUN  12/01/2013 17  6 - 23 mg/dL Final  . Creatinine, Ser 12/01/2013 0.76  0.50 - 1.35 mg/dL Final  . Calcium 12/01/2013 9.6  8.4 - 10.5 mg/dL Final  . GFR calc non Af Amer 12/01/2013 >90  >90 mL/min Final  . GFR calc Af Amer 12/01/2013 >90  >90 mL/min Final   Comment: (NOTE)                          The eGFR has been calculated using the CKD EPI equation.                          This calculation has not been validated in all clinical situations.                          eGFR's persistently <90 mL/min signify possible Chronic Kidney                          Disease.  . WBC 12/01/2013 8.9  4.0 - 10.5 K/uL Final  . RBC 12/01/2013 5.27  4.22 - 5.81 MIL/uL Final  . Hemoglobin 12/01/2013 16.7  13.0 - 17.0 g/dL Final  . HCT 12/01/2013 48.9  39.0 - 52.0 % Final  . MCV 12/01/2013 92.8  78.0 - 100.0 fL Final  . MCH 12/01/2013 31.7  26.0 - 34.0 pg Final  . MCHC 12/01/2013 34.2  30.0 - 36.0 g/dL Final  . RDW 12/01/2013 12.8  11.5 - 15.5 % Final  . Platelets 12/01/2013 249  150 - 400 K/uL Final    Recent Labs  12/07/13 0420  HGB 14.3    Recent Labs  12/07/13 0420  WBC 14.3*  RBC 4.54  HCT 42.0  PLT 239    Recent Labs  12/07/13 0420  NA 138  K 4.2  CL 103  CO2 27  BUN 11  CREATININE 0.68  GLUCOSE 137*  CALCIUM 8.6   No results found for this basename: LABPT, INR,  in the last 72 hours  X-Rays:Dg Lumbar Spine 2-3 Views  12/01/2013   CLINICAL DATA:  Preop for lumbar decompression.  EXAM: LUMBAR SPINE - 2-3 VIEW  COMPARISON:  None.  FINDINGS: No fracture. No spondylolisthesis. There is a mild dextroscoliosis of the upper lumbar spine. Moderate loss of disk height is noted at L2-L3 and L4-L5 with mild loss of disk height at L3-L4. There are small endplate osteophytes at these levels.  Soft tissues are unremarkable.  IMPRESSION: 1. No fracture or acute finding. 2. Disk degenerative changes and mild scoliosis as detailed.   Electronically Signed   By: Lajean Manes M.D.   On: 12/01/2013  11:49   Dg Spine Portable 1 View  12/06/2013   CLINICAL DATA:  Lumbar disc herniation. Intraoperative localization.  EXAM: PORTABLE SPINE - 1 VIEW  COMPARISON:  Prior exam earlier today  FINDINGS: Intraoperative cross-table ladder radiograph now shows a probe overlying the spinal canal of the level of L2-3.  IMPRESSION: Intraoperative localization of L2-3.   Electronically Signed   By: Earle Gell M.D.   On: 12/06/2013 13:05   Dg Spine Portable 1 View  12/06/2013   CLINICAL DATA:  Lumbar decompression L2-3 and L5-S1  EXAM: PORTABLE SPINE -  1 VIEW  COMPARISON:  12/06/2013 at 1209 hr  FINDINGS: Surgical hardware at L2-3.  Probe is posterior to the L3 vertebral body.  IMPRESSION: Lumbar localization as above.   Electronically Signed   By: Julian Hy M.D.   On: 12/06/2013 12:36   Dg Spine Portable 1 View  12/06/2013   CLINICAL DATA:  Lumbar disc herniation.  EXAM: PORTABLE SPINE - 1 VIEW  COMPARISON:  Prior study today  FINDINGS: Intraoperative cross-table lateral radiograph shows posterior retractors with the probe overlying the intervertebral disc space at level of L5-S1.  IMPRESSION: Probe tip overlies the L5-S1 intervertebral disc space.   Electronically Signed   By: Earle Gell M.D.   On: 12/06/2013 12:19   Dg Spine Portable 1 View  12/06/2013   CLINICAL DATA:  Lumbar decompression L2-3 and L5-S1  EXAM: PORTABLE SPINE - 1 VIEW  COMPARISON:  12/06/2013 at 1110 hr  FINDINGS: Single lateral view demonstrates surgical hardware at L5-S1.  IMPRESSION: Intraoperative localization as above.   Electronically Signed   By: Julian Hy M.D.   On: 12/06/2013 11:43   Dg Spine Portable 1 View  12/06/2013   CLINICAL DATA:  Lumbar decompression  EXAM: PORTABLE SPINE - 1 VIEW  COMPARISON:  Dec 01, 2013  FINDINGS: There are metallic probes with tips overlying the L2 spinous process, posterior to the superior aspect of the L3 vertebral body, posterior to the mid L5 level, and posterior to the upper S1 level.  There is moderate disc space narrowing at L2-3, L3-4, L4-5, and L5-S1. No fracture or spondylolisthesis.  IMPRESSION: Probe tip side levels as outlined above. Multilevel osteoarthritic change. No fracture or spondylolisthesis.   Electronically Signed   By: Lowella Grip M.D.   On: 12/06/2013 11:23    EKG:No orders found for this or any previous visit.   Hospital Course: Patient was admitted to [facility] and taken to the OR and underwent the above state procedure without complications.  Patient tolerated the procedure well and was later transferred to the recovery room and then to the orthopaedic floor for postoperative care.  They were given PO and IV analgesics for pain control following their surgery.  They were given 24 hours of postoperative antibiotics.   PT was consulted postop to assist with mobility and transfers.  The patient was allowed to be WBAT with therapy and was taught back precautions. Discharge planning was consulted to help with postop disposition and equipment needs.  Patient had a good night on the evening of surgery and started to get up OOB with therapy on day one. Patient was seen in rounds and was ready to go home on day one.  They were given discharge instructions and dressing directions.  They were instructed on when to follow up in the office with Dr. Tonita Cong.   Diet: Regular diet Activity:WBAT Follow-up:in 10-14 days Disposition - Home Discharged Condition: good   Discharge Instructions   Call MD / Call 911    Complete by:  As directed   If you experience chest pain or shortness of breath, CALL 911 and be transported to the hospital emergency room.  If you develope a fever above 101 F, pus (white drainage) or increased drainage or redness at the wound, or calf pain, call your surgeon's office.     Constipation Prevention    Complete by:  As directed   Drink plenty of fluids.  Prune juice may be helpful.  You may use a stool softener, such as  Colace (over  the counter) 100 mg twice a day.  Use MiraLax (over the counter) for constipation as needed.     Diet - low sodium heart healthy    Complete by:  As directed      Increase activity slowly as tolerated    Complete by:  As directed             Medication List         docusate sodium 100 MG capsule  Commonly known as:  COLACE  Take 1 capsule (100 mg total) by mouth 2 (two) times daily.     escitalopram 10 MG tablet  Commonly known as:  LEXAPRO  Take 10 mg by mouth every morning.     methocarbamol 500 MG tablet  Commonly known as:  ROBAXIN  Take 1 tablet (500 mg total) by mouth every 6 (six) hours as needed for muscle spasms.     multivitamin with minerals Tabs tablet  Take 1 tablet by mouth daily.     oxyCODONE-acetaminophen 5-325 MG per tablet  Commonly known as:  ROXICET  Take 1-2 tablets by mouth every 4 (four) hours as needed for severe pain.           Follow-up Information   Follow up with BEANE,JEFFREY C, MD In 2 weeks. (For suture removal)    Specialty:  Orthopedic Surgery   Contact information:   [address] [place] [address] [phone]       Signed: Lacie Draft, PA-C Orthopaedic Surgery 12/07/2013, 7:45 AM

## 2013-12-07 NOTE — Progress Notes (Signed)
Subjective: 1 Day Post-Op Procedure(s) (LRB): Decompression L5-S1,L2-L3, removal of lipoma at L2-L3, microdiscectomy at S1  (N/A) Patient reports pain as mild.  Reports mild incisional pain, well controlled. Leg pain and tingling already improved. Foley removed this AM, has not voided yet. No other c/o. Feeling ready for D/C home today. Seen by myself and Dr. Tonita Cong in AM rounds.  Objective: Vital signs in last 24 hours: Temp:  [97.3 F (36.3 C)-98.4 F (36.9 C)] 97.7 F (36.5 C) (06/04 0620) Pulse Rate:  [70-99] 70 (06/04 0620) Resp:  [12-20] 16 (06/04 0620) BP: (99-124)/(44-69) 99/60 mmHg (06/04 0620) SpO2:  [96 %-100 %] 98 % (06/04 0620) Weight:  [124.739 kg (275 lb)] 124.739 kg (275 lb) (06/04 0027)  Intake/Output from previous day: 06/03 0701 - 06/04 0700 In: 4445 [P.O.:120; I.V.:4225; IV Piggyback:100] Out: 4990 [Urine:4840; Blood:150] Intake/Output this shift:     Recent Labs  12/07/13 0420  HGB 14.3    Recent Labs  12/07/13 0420  WBC 14.3*  RBC 4.54  HCT 42.0  PLT 239    Recent Labs  12/07/13 0420  NA 138  K 4.2  CL 103  CO2 27  BUN 11  CREATININE 0.68  GLUCOSE 137*  CALCIUM 8.6   No results found for this basename: LABPT, INR,  in the last 72 hours  Neurologically intact ABD soft Neurovascular intact Sensation intact distally Intact pulses distally Dorsiflexion/Plantar flexion intact Incision: dressing C/D/I and no drainage No cellulitis present Compartment soft no calf pain or sign of DVT  Assessment/Plan: 1 Day Post-Op Procedure(s) (LRB): Decompression L5-S1,L2-L3, removal of lipoma at L2-L3, microdiscectomy at S1  (N/A) Advance diet Up with therapy D/C IV fluids Discussed D/C instructions, dressing instructions, Lspine precautions PT for ambulation/precautions today If he does not void 6 hrs from foley removal, bladder scan and straight cath prn Plan to D/C home today as long as he tolerates PT well, is voiding without difficulty,  and pain well controlled Follow up 2 weeks in the office for staple removal  Jaclyn M. Bissell 12/07/2013, 7:42 AM

## 2013-12-07 NOTE — Op Note (Signed)
NAMEMarland Woods  DEXTER, SIGNOR NO.:  [id number]  MEDICAL RECORD NO.:  [id number]  LOCATION:  4782                         FACILITY:  [facility]  PHYSICIAN:  Susa Day, M.D.    DATE OF BIRTH:  07-06-1971  DATE OF PROCEDURE:  12/06/2013 DATE OF DISCHARGE:                              OPERATIVE REPORT   PREOPERATIVE DIAGNOSES: 1. Epidural lipoma, L2-3. 2. Spinal stenosis, herniated nucleus pulposus at L5-S1, right.  POSTOPERATIVE DIAGNOSES: 1. Epidural lipoma, L2-3. 2. Spinal stenosis, herniated nucleus pulposus at L5-S1, right.  PROCEDURES: 1. Micro lumbar decompression L5-S1 right with foraminotomies of L5     and S1. 2. Microdiskectomy, L5-S1 right. 3. Central lumbar decompression at L2-3. 4. Excision of epidural lipoma, L2-3. 5. Bilateral foraminotomies of L3. Technical difficulty increased by the patient's morbid obesity.  His BMI was 38.5.  ANESTHESIA:  General.  ASSISTANT:  Cleophas Dunker, PA  SPECIMENS:  Lipoma to Pathology as well as L5-S1 disk.  BRIEF HISTORY:  A 43 year old with severe right lower extremity radicular pain, L5-S1 nerve root distribution secondary to disk herniation, spinal stenosis, refractory to conservative treatment, neural tension signs, EHL weakness, plantar flexion weakness.  MRI indicating lateral recess stenosis and disk herniation at 5-1, was indicated for decompression.  In addition, the patient experienced neurogenic claudication, anterior thighs with weakness secondary to presumed stenosis from an epidural lipoma and spinal stenosis at 2-3, severe centrally.  We discussed decompression at 5-1 as well as 2-3.  Risks and benefits discussed including bleeding, infection, damage to neurovascular structures, DVT, PE, anesthetic complications, need for revision in the future.  TECHNIQUE:  With the patient in supine position, after the induction of adequate general anesthesia, 3 g of Kefzol and 1.5 g of vancomycin, placed  prone on the [place] frame.  All bony prominences were well padded.  Lumbar region was prepped and draped in usual sterile fashion. A 14-gauge spinal needles were utilized to localize L2-3 and L5-S1 interspace, confirmed with x-ray.  We started first at L5-S1.  Incision was made over the spinous process of 5 and S1.  Subcutaneous tissue was dissected.  Electrocautery was utilized to achieve hemostasis. Significant subcutaneous adipose tissue, we had to use the McCullough retractor just to get down to the fascia.  We infiltrated with 0.25% Marcaine with epinephrine, made a small incision in the fascia, elevated the muscles at L5 and S1, used an extra long and McCullough retractors and confirmed the space at 5-1.  Used the operating microscope and brought it in the surgical field.  Hemilaminotomy of the caudad edge of 5 was performed with 2 mm Kerrison.  We detached the ligamentum flavum from the cephalad edge of S1 utilizing straight curette and 2 mm Kerrison.  Severe lateral recess stenosis was noted, multifactorial.  We decompressed the lateral recess to the medial border of the pedicle. Facet spur and ligamentum flavum hypertrophy.  I performed foraminotomy of S1.  Identified the S1 nerve root, gently mobilized it medially protecting the nerve.  Decompressed the rest of the lateral recess to the medial border of the pedicle.  Performed a foraminotomy of 5. Bipolar electrocautery was utilized to achieve hemostasis.  Focal disk herniation was  noted.  I performed an annulotomy and copious portion of disk material was removed from the disk space with straight upbiting pituitary, further mobilized with an Epstein preserving the endplates. Following full diskectomy of herniated material, I checked beneath thecal sac that exited the root of the foramen of L5 and S1.  No residual disk herniation compressing the root.  There was 1 cm of excursion of the S1 nerve root medial to the pedicle without  tension. We copiously irrigated disk space with antibiotic irrigation. Inspection revealed no evidence of CSF leakage or active bleeding.  We obtained a confirmatory radiograph at 5-1.  We then copiously irrigated the wound, placed thrombin-soaked Gelfoam, and repaired the dorsolumbar fascia with 1 Vicryl, subcu with multiple layers of 2-0 with copious irrigation, the skin with staples.  That wound was dressed sterilely. We turned our attention towards L2-3.  Incision was made from above the spinous processes of 2 to below to the 2-3 space.  Subcutaneous tissue was dissected.  Electrocautery was utilized to achieve hemostasis. Marcaine with epinephrine was utilized to infiltrate the paraspinous musculature, divided dorsolumbar fascia in line with the skin incision. We elevated the paraspinous musculature bilaterally, placed McCullough retractor, confirmatory radiograph obtained.  I then removed part of the spinous processes of 2 with a Leksell rongeur and the interspinous ligament, part of 3.  I used the operating microscope.  Performed hemilaminotomies of the caudad edge of 2 bilaterally.  Detached ligamentum flavum from the cephalad edge of 3.  Had hypertrophic ligamentum and facet.  There was slight curvature from scoliosis, performed foraminotomies of 3.  I removed the ligamentum flavum from the interspace.  Decompressed the lateral recesses.  There was very large epidural lipoma noted and this was removed with pickups in its entirety and sent to Pathology for appropriate evaluation.  Good restoration of the thecal sac.  I then was able to pass a neural probe cephalad above the pedicle of 2 and below the pedicle of 3, obtained a confirmatory radiograph.  No disk herniation was noted.  Bipolar electrocautery was utilized to achieve hemostasis.  Copiously irrigated the wound.  Placed thrombin-soaked Gelfoam in that wound.  Closed the dorsolumbar fascia with 1-Vicryl, subcu with 2-0, and  skin with staples.  Wound was dressed sterilely.  The patient was then placed supine on the hospital bed, extubated without difficulty, and transported to the recovery room in a satisfactory condition.  The patient tolerated the procedure well.  No complications.  Blood loss 50 mL.  PA was utilized for general intermittent nerve retraction, patient positioning, closure, etc.     Susa Day, M.D.     Geralynn Rile  D:  12/06/2013  T:  12/07/2013  Job:  [id number]

## 2013-12-07 NOTE — Clinical Social Work Note (Signed)
Referred to CSW 12/06/13 for ?SNF. Chart reviewed and have noted plans for dc to home today. CSW to sign off- please contact us if SW needs arise. Eduard Clos, MSW, [place]

## 2013-12-07 NOTE — Evaluation (Signed)
Physical Therapy One Time Evaluation Patient Details Name: Darren Woods MRN: [id number] DOB: 01/02/1971 Today's Date: 12/07/2013   History of Present Illness  Pt is a 43 year old pleasant male s/p decompression L5-S1,L2-L3, removal of lipoma at L2-L3, microdiscectomy at S1  due to stenosis and HNP.  Clinical Impression  Patient evaluated by Physical Therapy with no further acute PT needs identified. All education has been completed and the patient has no further questions. No follow-up Physial Therapy or equipment needs. PT is signing off. Thank you for this referral.     Follow Up Recommendations No PT follow up    Equipment Recommendations  None recommended by PT    Recommendations for Other Services       Precautions / Restrictions Precautions Precautions: Back Precaution Comments: able to verbalize all back precautions      Mobility  Bed Mobility Overal bed mobility: Needs Assistance Bed Mobility: Supine to Sit     Supine to sit: Supervision     General bed mobility comments: verbal cue to remind pt to use log roll technique  Transfers Overall transfer level: Needs assistance   Transfers: Sit to/from Stand Sit to Stand: Supervision;Modified independent (Device/Increase time)         General transfer comment: performed well maintaining straight spine  Ambulation/Gait Ambulation/Gait assistance: Supervision;Modified independent (Device/Increase time) Ambulation Distance (Feet): 300 Feet Assistive device: None Gait Pattern/deviations: Antalgic;Step-through pattern     General Gait Details: antalgic type gait pattern pt reports due to presurgical weakness R LE  Stairs Stairs: Yes Stairs assistance: Supervision Stair Management: Alternating pattern;Step to pattern;One rail Left;Forwards Number of Stairs: 4 General stair comments: pt able to perform alternating pattern without issues, also discussed step to if having a painful day/weaker R  LE  Wheelchair Mobility    Modified Rankin (Stroke Patients Only)       Balance                                             Pertinent Vitals/Pain No reports of pain except min incisional pain    Home Living Family/patient expects to be discharged to:: Private residence Living Arrangements: Spouse/significant other Available Help at Discharge: Family Type of Home: House Home Access: Stairs to enter Entrance Stairs-Rails: Left Entrance Stairs-Number of Steps: 3 Home Layout: One level Home Equipment: None      Prior Function Level of Independence: Independent               Hand Dominance        Extremity/Trunk Assessment   Upper Extremity Assessment: Generalized weakness           Lower Extremity Assessment: RLE deficits/detail RLE Deficits / Details: weakness of DF and PF, unable to perform single limb heel raise, limited toe raise AROM in standing, reports no longer has presurgical symptoms       Communication   Communication: No difficulties  Cognition Arousal/Alertness: Awake/alert Behavior During Therapy: WFL for tasks assessed/performed Overall Cognitive Status: Within Functional Limits for tasks assessed                      General Comments      Exercises Other Exercises Other Exercises: pt educated to perform heel raises and toe raises in standing bilaterally together and then single limb for progression for strengthening R ankle  Assessment/Plan    PT Assessment Patent does not need any further PT services  PT Diagnosis     PT Problem List    PT Treatment Interventions     PT Goals (Current goals can be found in the Care Plan section) Acute Rehab PT Goals Patient Stated Goal: less pain  PT Goal Formulation: No goals set, d/c therapy    Frequency     Barriers to discharge        Co-evaluation               End of Session   Activity Tolerance: Patient tolerated treatment well Patient  left: with family/visitor present (standing- to use urinal, spouse present)      Functional Assessment Tool Used: clinical judgement Functional Limitation: Mobility: Walking and moving around Mobility: Walking and Moving Around Current Status ([id number]): At least 1 percent but less than 20 percent impaired, limited or restricted Mobility: Walking and Moving Around Goal Status [phone]): 0 percent impaired, limited or restricted Mobility: Walking and Moving Around Discharge Status [phone]): 0 percent impaired, limited or restricted    Time: 0920-0929 PT Time Calculation (min): 9 min   Charges:   PT Evaluation $Initial PT Evaluation Tier I: 1 Procedure PT Treatments $Gait Training: 8-22 mins   PT G Codes:   Functional Assessment Tool Used: clinical judgement Functional Limitation: Mobility: Walking and moving around    [employer] 12/07/2013, 10:47 AM Carmelia Bake, PT, DPT 12/07/2013 Pager: [phone]

## 2013-12-07 NOTE — Evaluation (Signed)
Occupational Therapy Evaluation Patient Details Name: Darren Woods MRN: [id number] DOB: [birth year] Today's Date: 12/07/2013    History of Present Illness HNP AND STENOSIS L5-S1,   L2-L3    Clinical Impression   Pt presents to OT s/p back surgery. All education completed regarding ADL activity and back surgery    Follow Up Recommendations  No OT follow up    Equipment Recommendations  None recommended by OT           Mobility Bed Mobility Overal bed mobility: Needs Assistance Bed Mobility: Supine to Sit     Supine to sit: Min guard        Transfers Overall transfer level: Needs assistance   Transfers: Sit to/from Stand Sit to Stand: Min guard         General transfer comment: educated pt and wife regarding bed mobility and back precautions         ADL Overall ADL's : Needs assistance/impaired                                       General ADL Comments: Educated pt on back precautions with ADL activity . Pt and wife verbalize understanding.  Pt overall S- min guard with ADL activity .                Extremity/Trunk Assessment Upper Extremity Assessment Upper Extremity Assessment: Generalized weakness           Communication Communication Communication: No difficulties   Cognition Arousal/Alertness: Awake/alert Behavior During Therapy: WFL for tasks assessed/performed Overall Cognitive Status: Within Functional Limits for tasks assessed                     General Comments               Home Living Family/patient expects to be discharged to:: Private residence Living Arrangements: Spouse/significant other Available Help at Discharge: Family Type of Home: House       Home Layout: One level     Bathroom Shower/Tub: Occupational psychologist: Standard     Home Equipment: None          Prior Functioning/Environment Level of Independence: Independent             OT Diagnosis:      OT Problem List:     OT Treatment/Interventions:      OT Goals(Current goals can be found in the care plan section) Acute Rehab OT Goals Patient Stated Goal: less pain  OT Goal Formulation: With patient Potential to Achieve Goals: Good  OT Frequency:                End of Session Nurse Communication: Mobility status  Activity Tolerance: Patient tolerated treatment well Patient left: in bed;with nursing/sitter in room;with call bell/phone within reach;with family/visitor present   Time: 0840-0900 OT Time Calculation (min): 20 min Charges:  OT General Charges $OT Visit: 1 Procedure OT Evaluation $Initial OT Evaluation Tier I: 1 Procedure OT Treatments $Self Care/Home Management : 8-22 mins G-Codes: OT G-codes **NOT FOR INPATIENT CLASS** Functional Assessment Tool Used: clinical observation Functional Limitation: Self care Self Care Current Status (J8841): At least 20 percent but less than 40 percent impaired, limited or restricted Self Care Goal Status (Y6063): At least 1 percent but less than 20 percent impaired, limited or restricted Self Care Discharge Status [id number]): At least  20 percent but less than 40 percent impaired, limited or restricted  Betsy Pries 12/07/2013, 9:04 AM

## 2016-06-01 HISTORY — PX: KNEE ARTHROSCOPY: SHX127

## 2016-11-26 NOTE — Progress Notes (Signed)
Need orders in epic for 6-6 surgery

## 2016-11-26 NOTE — Progress Notes (Signed)
Left message with with sherry wills

## 2016-11-28 ENCOUNTER — Ambulatory Visit: Payer: Self-pay | Admitting: Specialist

## 2016-11-28 ENCOUNTER — Other Ambulatory Visit: Payer: Self-pay | Admitting: Specialist

## 2016-12-02 ENCOUNTER — Other Ambulatory Visit (HOSPITAL_COMMUNITY): Payer: Self-pay | Admitting: Emergency Medicine

## 2016-12-02 NOTE — Patient Instructions (Signed)
Pete Terril Chestnut  12/02/2016   Your procedure is scheduled on: 12-09-16  Report to [facility] Main  Entrance Take [facility]  elevators to 3rd floor to  [place] at 9:30AM.   Call this number if you have problems the morning of surgery [phone]    Remember: ONLY 1 PERSON MAY GO WITH YOU TO SHORT STAY TO GET  READY MORNING OF [place].  Do not eat food or drink liquids :After Midnight.     Take these medicines the morning of surgery with A SIP OF WATER: desvenlafaxine(Pristiq), omeprazole(Prilosec)                                You may not have any metal on your body including hair pins and              piercings  Do not wear jewelry, make-up, lotions, powders or perfumes, deodorant              Men may shave face and neck.   Do not bring valuables to the hospital. [place].  Contacts, dentures or bridgework may not be worn into surgery.  Leave suitcase in the car. After surgery it may be brought to your room.              Please read over the following fact sheets you were given: _____________________________________________________________________             [facility] - Preparing for Surgery Before surgery, you can play an important role.  Because skin is not sterile, your skin needs to be as free of germs as possible.  You can reduce the number of germs on your skin by washing with CHG (chlorahexidine gluconate) soap before surgery.  CHG is an antiseptic cleaner which kills germs and bonds with the skin to continue killing germs even after washing. Please DO NOT use if you have an allergy to CHG or antibacterial soaps.  If your skin becomes reddened/irritated stop using the CHG and inform your nurse when you arrive at Short Stay. Do not shave (including legs and underarms) for at least 48 hours prior to the first CHG shower.  You may shave your face/neck. Please follow these instructions  carefully:  1.  Shower with CHG Soap the night before surgery and the  morning of Surgery.  2.  If you choose to wash your hair, wash your hair first as usual with your  normal  shampoo.  3.  After you shampoo, rinse your hair and body thoroughly to remove the  shampoo.                           4.  Use CHG as you would any other liquid soap.  You can apply chg directly  to the skin and wash                       Gently with a scrungie or clean washcloth.  5.  Apply the CHG Soap to your body ONLY FROM THE NECK DOWN.   Do not use on face/ open  Wound or open sores. Avoid contact with eyes, ears mouth and genitals (private parts).                       Wash face,  Genitals (private parts) with your normal soap.             6.  Wash thoroughly, paying special attention to the area where your surgery  will be performed.  7.  Thoroughly rinse your body with warm water from the neck down.  8.  DO NOT shower/wash with your normal soap after using and rinsing off  the CHG Soap.                9.  Pat yourself dry with a clean towel.            10.  Wear clean pajamas.            11.  Place clean sheets on your bed the night of your first shower and do not  sleep with pets. Day of Surgery : Do not apply any lotions/deodorants the morning of surgery.  Please wear clean clothes to the hospital/surgery center.  FAILURE TO FOLLOW THESE INSTRUCTIONS MAY RESULT IN THE CANCELLATION OF YOUR SURGERY PATIENT SIGNATURE_________________________________  NURSE SIGNATURE__________________________________  ________________________________________________________________________   Adam Phenix  An incentive spirometer is a tool that can help keep your lungs clear and active. This tool measures how well you are filling your lungs with each breath. Taking long deep breaths may help reverse or decrease the chance of developing breathing (pulmonary) problems (especially infection)  following:  A long period of time when you are unable to move or be active. BEFORE THE PROCEDURE   If the spirometer includes an indicator to show your best effort, your nurse or respiratory therapist will set it to a desired goal.  If possible, sit up straight or lean slightly forward. Try not to slouch.  Hold the incentive spirometer in an upright position. INSTRUCTIONS FOR USE  1. Sit on the edge of your bed if possible, or sit up as far as you can in bed or on a chair. 2. Hold the incentive spirometer in an upright position. 3. Breathe out normally. 4. Place the mouthpiece in your mouth and seal your lips tightly around it. 5. Breathe in slowly and as deeply as possible, raising the piston or the ball toward the top of the column. 6. Hold your breath for 3-5 seconds or for as long as possible. Allow the piston or ball to fall to the bottom of the column. 7. Remove the mouthpiece from your mouth and breathe out normally. 8. Rest for a few seconds and repeat Steps 1 through 7 at least 10 times every 1-2 hours when you are awake. Take your time and take a few normal breaths between deep breaths. 9. The spirometer may include an indicator to show your best effort. Use the indicator as a goal to work toward during each repetition. 10. After each set of 10 deep breaths, practice coughing to be sure your lungs are clear. If you have an incision (the cut made at the time of surgery), support your incision when coughing by placing a pillow or rolled up towels firmly against it. Once you are able to get out of bed, walk around indoors and cough well. You may stop using the incentive spirometer when instructed by your caregiver.  RISKS AND COMPLICATIONS  Take your time so you do not get  dizzy or light-headed.  If you are in pain, you may need to take or ask for pain medication before doing incentive spirometry. It is harder to take a deep breath if you are having pain. AFTER USE  Rest and  breathe slowly and easily.  It can be helpful to keep track of a log of your progress. Your caregiver can provide you with a simple table to help with this. If you are using the spirometer at home, follow these instructions: [place] IF:   You are having difficultly using the spirometer.  You have trouble using the spirometer as often as instructed.  Your pain medication is not giving enough relief while using the spirometer.  You develop fever of 100.5 F (38.1 C) or higher. SEEK IMMEDIATE MEDICAL CARE IF:   You cough up bloody sputum that had not been present before.  You develop fever of 102 F (38.9 C) or greater.  You develop worsening pain at or near the incision site. MAKE SURE YOU:   Understand these instructions.  Will watch your condition.  Will get help right away if you are not doing well or get worse. Document Released: 11/02/2006 Document Revised: 09/14/2011 Document Reviewed: 01/03/2007 [facility] Patient Information 2014 [place], [place].   ________________________________________________________________________

## 2016-12-02 NOTE — Progress Notes (Signed)
LOV Darren Woods 10-28-16 epic care everywhere

## 2016-12-04 ENCOUNTER — Encounter (HOSPITAL_COMMUNITY)
Admission: RE | Admit: 2016-12-04 | Discharge: 2016-12-04 | Disposition: A | Discharge status: Home / Self Care | Payer: Worker's Compensation | Source: Ambulatory Visit | Attending: Specialist | Admitting: Specialist

## 2016-12-04 ENCOUNTER — Ambulatory Visit (HOSPITAL_COMMUNITY)
Admission: RE | Admit: 2016-12-04 | Discharge: 2016-12-04 | Disposition: A | Discharge status: Home / Self Care | Payer: Worker's Compensation | Source: Ambulatory Visit | Attending: Specialist | Admitting: Specialist

## 2016-12-04 ENCOUNTER — Encounter (HOSPITAL_COMMUNITY): Payer: Self-pay

## 2016-12-04 DIAGNOSIS — Z01818 Encounter for other preprocedural examination: Secondary | ICD-10-CM | POA: Insufficient documentation

## 2016-12-04 DIAGNOSIS — M5126 Other intervertebral disc displacement, lumbar region: Secondary | ICD-10-CM | POA: Diagnosis not present

## 2016-12-04 LAB — CBC
HEMATOCRIT: 47.3 % (ref 39.0–52.0)
Hemoglobin: 16 g/dL (ref 13.0–17.0)
MCH: 31.2 pg (ref 26.0–34.0)
MCHC: 33.8 g/dL (ref 30.0–36.0)
MCV: 92.2 fL (ref 78.0–100.0)
Platelets: 271 10*3/uL (ref 150–400)
RBC: 5.13 MIL/uL (ref 4.22–5.81)
RDW: 13.3 % (ref 11.5–15.5)
WBC: 7.8 10*3/uL (ref 4.0–10.5)

## 2016-12-04 LAB — BASIC METABOLIC PANEL
Anion gap: 6 (ref 5–15)
BUN: 15 mg/dL (ref 6–20)
CHLORIDE: 103 mmol/L (ref 101–111)
CO2: 27 mmol/L (ref 22–32)
Calcium: 8.8 mg/dL — ABNORMAL LOW (ref 8.9–10.3)
Creatinine, Ser: 0.79 mg/dL (ref 0.61–1.24)
GFR calc Af Amer: >60 mL/min (ref >60)
GFR calc non Af Amer: >60 mL/min (ref >60)
Glucose, Bld: 93 mg/dL (ref 65–99)
POTASSIUM: 4 mmol/L (ref 3.5–5.1)
SODIUM: 136 mmol/L (ref 135–145)

## 2016-12-04 LAB — SURGICAL PCR SCREEN
MRSA, PCR: NEGATIVE
Staphylococcus aureus: POSITIVE — AB

## 2016-12-04 NOTE — Progress Notes (Signed)
Left voice message regarding PCR results. Pt made aware during pre-op appt that he would required Betadine on day of procedure.   Pt returned call, and was made aware of positive results for Staph.

## 2016-12-08 MED ORDER — VANCOMYCIN HCL 10 G IV SOLR
1500.0000 mg | INTRAVENOUS | Status: AC
  Administered 2016-12-09: 1500 mg via INTRAVENOUS
  Filled 2016-12-08 (×2): qty 1500

## 2016-12-08 MED ORDER — DEXTROSE 5 % IV SOLN
3.0000 g | Freq: Once | INTRAVENOUS | Status: AC
  Administered 2016-12-09: 3 g via INTRAVENOUS
  Filled 2016-12-08: qty 3

## 2016-12-08 NOTE — H&P (Signed)
.    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 male who has been seen by Dr. Tonita Cong for ongoing back and LE pain after fall 04/26/16.  They have been followed and found to have continued progressive pain.  They have been treated conservatively in the past including medications.  Despite conservative measures, they continue to have pain.  MRI and XRAY show that the patient has:   MRI of the lumbar spine demonstrates a recurrent disc herniation, small, at L5-S1, flattening the L5 and S1 nerve roots. Associated disc degeneration. 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   It is felt that they would benefit from undergoing surgical intervention.  Risks and benefits have been discussed with the patient and they elect to proceed with surgery.  The patient has no contraindications to the upcoming procedure such as ongoing infection or progressive neurological disease.  Allergies: No Known Allergies   Medications: No prescriptions prior to admission.    Past Medical History: Past Medical History:  Diagnosis Date  . Anxiety   . GERD (gastroesophageal reflux disease)    occasional  . PONV (postoperative nausea and vomiting)    with first knee surgery     Past Surgical History: Past Surgical History:  Procedure Laterality Date  . CHOLECYSTECTOMY  1994  . KNEE ARTHROSCOPY Bilateral 1989  . LUMBAR LAMINECTOMY/DECOMPRESSION MICRODISCECTOMY N/A 12/06/2013   Procedure: Decompression L5-S1,L2-L3, removal of lipoma at L2-L3, microdiscectomy at S1 ;  Surgeon: [name], MD;  Location: [facility] ORS;  Service: Orthopedics;  Laterality: N/A;     Family History: No family history on file.  Social History: Social History  Substance  Use Topics  . Smoking status: Never Smoker  . Smokeless tobacco: Never Used  . Alcohol use Yes     Comment: occasional         Review of Systems Pertinent items are noted in HPI. Review of systems is negative for fevers, chest pain, shortness of breath, unexplained recent weight loss, loss of bowel or bladder function, burning with urination, joint swelling, rashes, weakness or numbness, difficulty with balance, easy bruising, excessive thirst, or frequent urination  Physical Exam:  Vitals: There were no vitals filed for this visit.  General appearance: alert and cooperative Extremities: extremities normal, atraumatic, no cyanosis or edema and On exam, in moderate distress. Straight leg raise with buttock, thigh and calf pain on the right, negative on the left. Diminished repetitive plantar flexion. EHL is 4+/5. Altered sensation in the S1 dermatome. Lumbar spine exam reveals no evidence of soft tissue swelling, deformity or skin ecchymosis. On palpation, there is no tenderness of the lumbar spine. No flank pain with percussion. The abdomen is soft and nontender. Nontender over the trochanters. No cellulitis or lymphadenopathy. 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   Surgery will be performed by Dr. Susa Day.  Risks and benefits have been discussed with the patient and they elect to proceed wth the procedure. 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 We will have him undergo decolonization and preoperative vancomycin.

## 2016-12-09 ENCOUNTER — Ambulatory Visit (HOSPITAL_COMMUNITY): Payer: Worker's Compensation

## 2016-12-09 ENCOUNTER — Encounter (HOSPITAL_COMMUNITY): Payer: Self-pay | Admitting: Anesthesiology

## 2016-12-09 ENCOUNTER — Ambulatory Visit (HOSPITAL_COMMUNITY): Payer: Worker's Compensation | Admitting: Anesthesiology

## 2016-12-09 ENCOUNTER — Encounter (HOSPITAL_COMMUNITY)
Admission: RE | Disposition: A | Discharge status: Home / Self Care | Payer: Self-pay | Source: Ambulatory Visit | Attending: Specialist

## 2016-12-09 ENCOUNTER — Ambulatory Visit (HOSPITAL_COMMUNITY)
Admission: RE | Admit: 2016-12-09 | Discharge: 2016-12-10 | Disposition: A | Discharge status: Home / Self Care | Payer: Worker's Compensation | Source: Ambulatory Visit | Attending: Specialist | Admitting: Specialist

## 2016-12-09 DIAGNOSIS — M5136 Other intervertebral disc degeneration, lumbar region: Secondary | ICD-10-CM | POA: Diagnosis not present

## 2016-12-09 DIAGNOSIS — M48061 Spinal stenosis, lumbar region without neurogenic claudication: Secondary | ICD-10-CM | POA: Diagnosis not present

## 2016-12-09 DIAGNOSIS — M5126 Other intervertebral disc displacement, lumbar region: Secondary | ICD-10-CM | POA: Diagnosis present

## 2016-12-09 DIAGNOSIS — Z8614 Personal history of Methicillin resistant Staphylococcus aureus infection: Secondary | ICD-10-CM | POA: Diagnosis not present

## 2016-12-09 DIAGNOSIS — M4807 Spinal stenosis, lumbosacral region: Secondary | ICD-10-CM | POA: Insufficient documentation

## 2016-12-09 DIAGNOSIS — Z6841 Body Mass Index (BMI) 40.0 and over, adult: Secondary | ICD-10-CM | POA: Insufficient documentation

## 2016-12-09 DIAGNOSIS — Z419 Encounter for procedure for purposes other than remedying health state, unspecified: Secondary | ICD-10-CM

## 2016-12-09 DIAGNOSIS — W19XXXA Unspecified fall, initial encounter: Secondary | ICD-10-CM | POA: Insufficient documentation

## 2016-12-09 DIAGNOSIS — K219 Gastro-esophageal reflux disease without esophagitis: Secondary | ICD-10-CM | POA: Diagnosis not present

## 2016-12-09 DIAGNOSIS — Z9049 Acquired absence of other specified parts of digestive tract: Secondary | ICD-10-CM | POA: Insufficient documentation

## 2016-12-09 DIAGNOSIS — M5117 Intervertebral disc disorders with radiculopathy, lumbosacral region: Secondary | ICD-10-CM | POA: Diagnosis not present

## 2016-12-09 DIAGNOSIS — F419 Anxiety disorder, unspecified: Secondary | ICD-10-CM | POA: Insufficient documentation

## 2016-12-09 HISTORY — PX: LUMBAR LAMINECTOMY/DECOMPRESSION MICRODISCECTOMY: SHX5026

## 2016-12-09 SURGERY — LUMBAR LAMINECTOMY/DECOMPRESSION MICRODISCECTOMY 1 LEVEL
Anesthesia: General | Laterality: Right

## 2016-12-09 MED ORDER — ONDANSETRON HCL 4 MG/2ML IJ SOLN
INTRAMUSCULAR | Status: DC | PRN
  Administered 2016-12-09: 4 mg via INTRAVENOUS

## 2016-12-09 MED ORDER — METOCLOPRAMIDE HCL 5 MG/ML IJ SOLN
INTRAMUSCULAR | Status: AC
  Filled 2016-12-09: qty 2

## 2016-12-09 MED ORDER — EPHEDRINE SULFATE-NACL 50-0.9 MG/10ML-% IV SOSY
PREFILLED_SYRINGE | INTRAVENOUS | Status: DC | PRN
  Administered 2016-12-09 (×4): 5 mg via INTRAVENOUS
  Administered 2016-12-09: 10 mg via INTRAVENOUS

## 2016-12-09 MED ORDER — FENTANYL CITRATE (PF) 100 MCG/2ML IJ SOLN
INTRAMUSCULAR | Status: DC | PRN
  Administered 2016-12-09: 100 ug via INTRAVENOUS
  Administered 2016-12-09: 50 ug via INTRAVENOUS
  Administered 2016-12-09: 25 ug via INTRAVENOUS
  Administered 2016-12-09: 50 ug via INTRAVENOUS
  Administered 2016-12-09: 25 ug via INTRAVENOUS

## 2016-12-09 MED ORDER — MIDAZOLAM HCL 2 MG/2ML IJ SOLN
INTRAMUSCULAR | Status: AC
  Filled 2016-12-09: qty 2

## 2016-12-09 MED ORDER — BUPIVACAINE-EPINEPHRINE (PF) 0.5% -1:200000 IJ SOLN
INTRAMUSCULAR | Status: DC | PRN
  Administered 2016-12-09: 12 mL via PERINEURAL

## 2016-12-09 MED ORDER — PHENOL 1.4 % MT LIQD: 1.0000 | OROMUCOSAL | Status: DC | PRN

## 2016-12-09 MED ORDER — ACETAMINOPHEN 650 MG RE SUPP: 650.0000 mg | RECTAL | Status: DC | PRN

## 2016-12-09 MED ORDER — ONDANSETRON HCL 4 MG/2ML IJ SOLN
INTRAMUSCULAR | Status: AC
  Filled 2016-12-09: qty 2

## 2016-12-09 MED ORDER — ONDANSETRON HCL 4 MG/2ML IJ SOLN: 4.0000 mg | Freq: Four times a day (QID) | INTRAMUSCULAR | Status: DC | PRN

## 2016-12-09 MED ORDER — PROPOFOL 10 MG/ML IV BOLUS
INTRAVENOUS | Status: AC
  Filled 2016-12-09: qty 20

## 2016-12-09 MED ORDER — BUPIVACAINE-EPINEPHRINE (PF) 0.5% -1:200000 IJ SOLN
INTRAMUSCULAR | Status: AC
  Filled 2016-12-09: qty 30

## 2016-12-09 MED ORDER — DEXAMETHASONE SODIUM PHOSPHATE 10 MG/ML IJ SOLN
INTRAMUSCULAR | Status: AC
  Filled 2016-12-09: qty 1

## 2016-12-09 MED ORDER — SODIUM CHLORIDE 0.9 % IR SOLN
Status: AC
  Filled 2016-12-09: qty 500000

## 2016-12-09 MED ORDER — KCL IN DEXTROSE-NACL 20-5-0.45 MEQ/L-%-% IV SOLN
INTRAVENOUS | Status: DC
  Filled 2016-12-09: qty 1000

## 2016-12-09 MED ORDER — MIDAZOLAM HCL 5 MG/5ML IJ SOLN
INTRAMUSCULAR | Status: DC | PRN
  Administered 2016-12-09: 2 mg via INTRAVENOUS

## 2016-12-09 MED ORDER — SUGAMMADEX SODIUM 500 MG/5ML IV SOLN
INTRAVENOUS | Status: AC
  Filled 2016-12-09: qty 5

## 2016-12-09 MED ORDER — LIDOCAINE 2% (20 MG/ML) 5 ML SYRINGE
INTRAMUSCULAR | Status: DC | PRN
  Administered 2016-12-09: 100 mg via INTRAVENOUS

## 2016-12-09 MED ORDER — ONDANSETRON HCL 4 MG PO TABS: 4.0000 mg | ORAL_TABLET | Freq: Four times a day (QID) | ORAL | Status: DC | PRN

## 2016-12-09 MED ORDER — EPHEDRINE 5 MG/ML INJ
INTRAVENOUS | Status: AC
  Filled 2016-12-09: qty 20

## 2016-12-09 MED ORDER — DOCUSATE SODIUM 100 MG PO CAPS: 100.0000 mg | ORAL_CAPSULE | Freq: Two times a day (BID) | ORAL | 1 refills | Status: DC | PRN

## 2016-12-09 MED ORDER — THROMBIN 5000 UNITS EX SOLR
CUTANEOUS | Status: DC | PRN
  Administered 2016-12-09: 10000 [IU] via TOPICAL

## 2016-12-09 MED ORDER — ACETAMINOPHEN 325 MG PO TABS: 650.0000 mg | ORAL_TABLET | ORAL | Status: DC | PRN

## 2016-12-09 MED ORDER — ALUM & MAG HYDROXIDE-SIMETH 200-200-20 MG/5ML PO SUSP: 30.0000 mL | Freq: Four times a day (QID) | ORAL | Status: DC | PRN

## 2016-12-09 MED ORDER — VANCOMYCIN HCL 10 G IV SOLR
1500.0000 mg | Freq: Once | INTRAVENOUS | Status: AC
  Administered 2016-12-09: 22:00:00 1500 mg via INTRAVENOUS
  Filled 2016-12-09: qty 1500

## 2016-12-09 MED ORDER — ROCURONIUM BROMIDE 10 MG/ML (PF) SYRINGE
PREFILLED_SYRINGE | INTRAVENOUS | Status: DC | PRN
  Administered 2016-12-09: 20 mg via INTRAVENOUS
  Administered 2016-12-09 (×2): 10 mg via INTRAVENOUS
  Administered 2016-12-09: 50 mg via INTRAVENOUS

## 2016-12-09 MED ORDER — METHOCARBAMOL 500 MG PO TABS
500.0000 mg | ORAL_TABLET | Freq: Four times a day (QID) | ORAL | Status: DC | PRN
  Administered 2016-12-09 – 2016-12-10 (×3): 500 mg via ORAL
  Filled 2016-12-09 (×3): qty 1

## 2016-12-09 MED ORDER — METHOCARBAMOL 500 MG PO TABS: 500.0000 mg | ORAL_TABLET | Freq: Four times a day (QID) | ORAL | 1 refills | Status: DC | PRN

## 2016-12-09 MED ORDER — PROPOFOL 10 MG/ML IV BOLUS
INTRAVENOUS | Status: DC | PRN
  Administered 2016-12-09: 200 mg via INTRAVENOUS

## 2016-12-09 MED ORDER — DEXTROSE 5 % IV SOLN
500.0000 mg | Freq: Four times a day (QID) | INTRAVENOUS | Status: DC | PRN
  Administered 2016-12-09: 500 mg via INTRAVENOUS
  Filled 2016-12-09: qty 550

## 2016-12-09 MED ORDER — FENTANYL CITRATE (PF) 250 MCG/5ML IJ SOLN
INTRAMUSCULAR | Status: AC
  Filled 2016-12-09: qty 5

## 2016-12-09 MED ORDER — HYDROMORPHONE HCL 1 MG/ML IJ SOLN
0.2500 mg | INTRAMUSCULAR | Status: DC | PRN
  Administered 2016-12-09 (×4): 0.5 mg via INTRAVENOUS

## 2016-12-09 MED ORDER — POLYMYXIN B SULFATE 500000 UNITS IJ SOLR
INTRAMUSCULAR | Status: DC | PRN
  Administered 2016-12-09: 500 mL

## 2016-12-09 MED ORDER — POLYETHYLENE GLYCOL 3350 17 G PO PACK: 17.0000 g | PACK | Freq: Every day | ORAL | 0 refills | Status: DC

## 2016-12-09 MED ORDER — POLYETHYLENE GLYCOL 3350 17 G PO PACK: 17.0000 g | PACK | Freq: Every day | ORAL | Status: DC | PRN

## 2016-12-09 MED ORDER — ROCURONIUM BROMIDE 50 MG/5ML IV SOSY
PREFILLED_SYRINGE | INTRAVENOUS | Status: AC
  Filled 2016-12-09: qty 5

## 2016-12-09 MED ORDER — RISAQUAD PO CAPS
1.0000 | ORAL_CAPSULE | Freq: Every day | ORAL | Status: DC
  Administered 2016-12-09 – 2016-12-10 (×2): 1 via ORAL
  Filled 2016-12-09 (×2): qty 1

## 2016-12-09 MED ORDER — DOCUSATE SODIUM 100 MG PO CAPS
100.0000 mg | ORAL_CAPSULE | Freq: Two times a day (BID) | ORAL | Status: DC
  Administered 2016-12-09 – 2016-12-10 (×2): 100 mg via ORAL
  Filled 2016-12-09 (×2): qty 1

## 2016-12-09 MED ORDER — MENTHOL 3 MG MT LOZG: 1.0000 | LOZENGE | OROMUCOSAL | Status: DC | PRN

## 2016-12-09 MED ORDER — SUGAMMADEX SODIUM 500 MG/5ML IV SOLN
INTRAVENOUS | Status: DC | PRN
  Administered 2016-12-09: 500 mg via INTRAVENOUS

## 2016-12-09 MED ORDER — OXYCODONE-ACETAMINOPHEN 10-325 MG PO TABS: 1.0000 | ORAL_TABLET | ORAL | 0 refills | Status: DC | PRN

## 2016-12-09 MED ORDER — VENLAFAXINE HCL ER 37.5 MG PO CP24
75.0000 mg | ORAL_CAPSULE | Freq: Every day | ORAL | Status: DC
  Administered 2016-12-10: 75 mg via ORAL
  Filled 2016-12-09: qty 2

## 2016-12-09 MED ORDER — OXYCODONE HCL 5 MG/5ML PO SOLN
5.0000 mg | Freq: Once | ORAL | Status: DC | PRN
Start: 2016-12-09 — End: 2016-12-09

## 2016-12-09 MED ORDER — HYDROMORPHONE HCL 1 MG/ML IJ SOLN
INTRAMUSCULAR | Status: AC
  Administered 2016-12-09: 0.5 mg via INTRAVENOUS
  Filled 2016-12-09: qty 2

## 2016-12-09 MED ORDER — BISACODYL 5 MG PO TBEC: 5.0000 mg | DELAYED_RELEASE_TABLET | Freq: Every day | ORAL | Status: DC | PRN

## 2016-12-09 MED ORDER — LACTATED RINGERS IV SOLN
INTRAVENOUS | Status: DC
  Administered 2016-12-09 (×3): via INTRAVENOUS

## 2016-12-09 MED ORDER — THROMBIN 5000 UNITS EX SOLR: OROMUCOSAL | Status: DC | PRN

## 2016-12-09 MED ORDER — THROMBIN 5000 UNITS EX SOLR
CUTANEOUS | Status: AC
  Filled 2016-12-09: qty 10000

## 2016-12-09 MED ORDER — MAGNESIUM CITRATE PO SOLN: 1.0000 | Freq: Once | ORAL | Status: DC | PRN

## 2016-12-09 MED ORDER — METOCLOPRAMIDE HCL 5 MG/ML IJ SOLN
INTRAMUSCULAR | Status: DC | PRN
  Administered 2016-12-09: 10 mg via INTRAVENOUS

## 2016-12-09 MED ORDER — OXYCODONE HCL 5 MG PO TABS
5.0000 mg | ORAL_TABLET | ORAL | Status: DC | PRN
  Administered 2016-12-09: 10 mg via ORAL
  Administered 2016-12-09: 18:00:00 5 mg via ORAL
  Administered 2016-12-10 (×3): 10 mg via ORAL
  Filled 2016-12-09 (×3): qty 2
  Filled 2016-12-09: qty 1
  Filled 2016-12-09: qty 2

## 2016-12-09 MED ORDER — CHLORHEXIDINE GLUCONATE 4 % EX LIQD: 60.0000 mL | Freq: Once | CUTANEOUS | Status: DC

## 2016-12-09 MED ORDER — OXYCODONE HCL 5 MG PO TABS
5.0000 mg | ORAL_TABLET | Freq: Once | ORAL | Status: DC | PRN
Start: 2016-12-09 — End: 2016-12-09

## 2016-12-09 MED ORDER — LIDOCAINE 2% (20 MG/ML) 5 ML SYRINGE
INTRAMUSCULAR | Status: AC
Start: 2016-12-09 — End: 2016-12-09
  Filled 2016-12-09: qty 10

## 2016-12-09 MED ORDER — HYDROMORPHONE HCL 1 MG/ML IJ SOLN: 1.0000 mg | INTRAMUSCULAR | Status: DC | PRN

## 2016-12-09 MED ORDER — DEXAMETHASONE SODIUM PHOSPHATE 10 MG/ML IJ SOLN
INTRAMUSCULAR | Status: DC | PRN
  Administered 2016-12-09: 10 mg via INTRAVENOUS

## 2016-12-09 MED ORDER — PANTOPRAZOLE SODIUM 40 MG PO TBEC
40.0000 mg | DELAYED_RELEASE_TABLET | Freq: Every day | ORAL | Status: DC
  Administered 2016-12-10: 09:00:00 40 mg via ORAL
  Filled 2016-12-09: qty 1

## 2016-12-09 SURGICAL SUPPLY — 49 items
BAG ZIPLOCK 12X15 (MISCELLANEOUS) IMPLANT
CLEANER TIP ELECTROSURG 2X2 (MISCELLANEOUS) ×3 IMPLANT
CLOSURE WOUND 1/2 X4 (GAUZE/BANDAGES/DRESSINGS) ×1
CLOTH 2% CHLOROHEXIDINE 3PK (PERSONAL CARE ITEMS) ×3 IMPLANT
COVER SURGICAL LIGHT HANDLE (MISCELLANEOUS) ×3 IMPLANT
DRAPE MICROSCOPE LEICA (MISCELLANEOUS) ×3 IMPLANT
DRAPE POUCH INSTRU U-SHP 10X18 (DRAPES) ×3 IMPLANT
DRAPE SHEET LG 3/4 BI-LAMINATE (DRAPES) ×3 IMPLANT
DRAPE SURG 17X11 SM STRL (DRAPES) ×3 IMPLANT
DRAPE UTILITY XL STRL (DRAPES) ×3 IMPLANT
DRSG AQUACEL AG ADV 3.5X 4 (GAUZE/BANDAGES/DRESSINGS) IMPLANT
DRSG AQUACEL AG ADV 3.5X 6 (GAUZE/BANDAGES/DRESSINGS) ×3 IMPLANT
DURAPREP 26ML APPLICATOR (WOUND CARE) ×3 IMPLANT
DURASEAL SPINE SEALANT 3ML (MISCELLANEOUS) IMPLANT
ELECT BLADE TIP CTD 4 INCH (ELECTRODE) IMPLANT
ELECT REM PT RETURN 15FT ADLT (MISCELLANEOUS) ×3 IMPLANT
GAUZE XEROFORM 1X8 LF (GAUZE/BANDAGES/DRESSINGS) ×3 IMPLANT
GLOVE BIOGEL PI IND STRL 7.0 (GLOVE) ×1 IMPLANT
GLOVE BIOGEL PI INDICATOR 7.0 (GLOVE) ×2
GLOVE SURG SS PI 7.0 STRL IVOR (GLOVE) ×3 IMPLANT
GLOVE SURG SS PI 7.5 STRL IVOR (GLOVE) ×3 IMPLANT
GLOVE SURG SS PI 8.0 STRL IVOR (GLOVE) ×6 IMPLANT
GOWN STRL REUS W/TWL XL LVL3 (GOWN DISPOSABLE) ×6 IMPLANT
HEMOSTAT SPONGE AVITENE ULTRA (HEMOSTASIS) IMPLANT
IV CATH 14GX2 1/4 (CATHETERS) ×3 IMPLANT
KIT BASIN OR (CUSTOM PROCEDURE TRAY) ×3 IMPLANT
KIT POSITIONING SURG ANDREWS (MISCELLANEOUS) ×3 IMPLANT
MANIFOLD NEPTUNE II (INSTRUMENTS) ×3 IMPLANT
NEEDLE SPNL 18GX3.5 QUINCKE PK (NEEDLE) ×6 IMPLANT
PACK LAMINECTOMY ORTHO (CUSTOM PROCEDURE TRAY) ×3 IMPLANT
PATTIES SURGICAL .5 X.5 (GAUZE/BANDAGES/DRESSINGS) IMPLANT
PATTIES SURGICAL .75X.75 (GAUZE/BANDAGES/DRESSINGS) ×3 IMPLANT
PATTIES SURGICAL 1X1 (DISPOSABLE) ×3 IMPLANT
RUBBERBAND STERILE (MISCELLANEOUS) ×6 IMPLANT
SPONGE SURGIFOAM ABS GEL 100 (HEMOSTASIS) ×3 IMPLANT
STAPLER VISISTAT (STAPLE) IMPLANT
STRIP CLOSURE SKIN 1/2X4 (GAUZE/BANDAGES/DRESSINGS) ×2 IMPLANT
SUT NURALON 4 0 TR CR/8 (SUTURE) IMPLANT
SUT PROLENE 3 0 PS 2 (SUTURE) ×3 IMPLANT
SUT VIC AB 1 CT1 27 (SUTURE)
SUT VIC AB 1 CT1 27XBRD ANTBC (SUTURE) IMPLANT
SUT VIC AB 1-0 CT2 27 (SUTURE) ×6 IMPLANT
SUT VIC AB 2-0 CT1 27 (SUTURE)
SUT VIC AB 2-0 CT1 TAPERPNT 27 (SUTURE) IMPLANT
SUT VIC AB 2-0 CT2 27 (SUTURE) ×6 IMPLANT
SYR 3ML LL SCALE MARK (SYRINGE) ×3 IMPLANT
TOWEL OR 17X26 10 PK STRL BLUE (TOWEL DISPOSABLE) ×3 IMPLANT
TOWEL OR NON WOVEN STRL DISP B (DISPOSABLE) ×3 IMPLANT
YANKAUER SUCT BULB TIP NO VENT (SUCTIONS) IMPLANT

## 2016-12-09 NOTE — Anesthesia Preprocedure Evaluation (Signed)
Anesthesia Evaluation  Patient identified by MRN, date of birth, ID band Patient awake    Reviewed: Allergy & Precautions, H&P , NPO status , Patient's Chart, lab work & pertinent test results  History of Anesthesia Complications (+) PONV  Airway Mallampati: II   Neck ROM: full    Dental   Pulmonary neg pulmonary ROS,    breath sounds clear to auscultation       Cardiovascular negative cardio ROS   Rhythm:regular Rate:Normal     Neuro/Psych PSYCHIATRIC DISORDERS Anxiety    GI/Hepatic GERD  ,  Endo/Other    Renal/GU      Musculoskeletal   Abdominal   Peds  Hematology   Anesthesia Other Findings   Reproductive/Obstetrics                             Anesthesia Physical Anesthesia Plan  ASA: II  Anesthesia Plan: General   Post-op Pain Management:    Induction: Intravenous  PONV Risk Score and Plan: 4 or greater and Ondansetron, Dexamethasone, Propofol, Midazolam, Scopolamine patch - Pre-op and Treatment may vary due to age  Airway Management Planned:   Additional Equipment:   Intra-op Plan:   Post-operative Plan:   Informed Consent: I have reviewed the patients History and Physical, chart, labs and discussed the procedure including the risks, benefits and alternatives for the proposed anesthesia with the patient or authorized representative who has indicated his/her understanding and acceptance.     Plan Discussed with: CRNA, Anesthesiologist and Surgeon  Anesthesia Plan Comments:         Anesthesia Quick Evaluation

## 2016-12-09 NOTE — Interval H&P Note (Signed)
History and Physical Interval Note:  12/09/2016 10:31 AM  Darren Woods  has presented today for surgery, with the diagnosis of Recurrent HNP stenosis L5-S1 right  The various methods of treatment have been discussed with the patient and family. After consideration of risks, benefits and other options for treatment, the patient has consented to  Procedure(s) with comments: Revision microlumbar decompression L5-S1 Right (Right) - 2 hrs as a surgical intervention .  The patient's history has been reviewed, patient examined, no change in status, stable for surgery.  I have reviewed the patient's chart and labs.  Questions were answered to the patient's satisfaction.     Shavy Beachem C

## 2016-12-09 NOTE — Progress Notes (Signed)
Pharmacy: post-op vancomycin  Patient is a 46 y.o M s/p Revision microlumbar decompression L5-S1 today. Patient received vancomycin 1500 mg IV x1 pre-op at 1037.  To give one dose of vancomycin post-op per MD's request.  Per RN, patient does not have a drain in place.  Plan: - vancomycin 1500 mg IV x1 at 2200 tonight. - pharmacy will sign off.  reconsult [place] if need further assistance  Thank you for asking pharmacy to participate in this pt's care.  Dia Sitter, PharmD, BCPS 12/09/2016 3:56 PM

## 2016-12-09 NOTE — Anesthesia Procedure Notes (Signed)
Procedure Name: Intubation Date/Time: 12/09/2016 11:20 AM Performed by: Farhana Fellows, Virgel Gess Pre-anesthesia Checklist: Patient identified, Emergency Drugs available, Suction available, Patient being monitored and Timeout performed Patient Re-evaluated:Patient Re-evaluated prior to inductionOxygen Delivery Method: Circle system utilized Preoxygenation: Pre-oxygenation with 100% oxygen Intubation Type: IV induction Ventilation: Mask ventilation without difficulty Laryngoscope Size: Mac and 4 Grade View: Grade II Tube type: Oral Tube size: 7.5 mm Number of attempts: 1 Airway Equipment and Method: Stylet Placement Confirmation: ETT inserted through vocal cords under direct vision,  positive ETCO2,  CO2 detector and breath sounds checked- equal and bilateral Secured at: 22 cm Tube secured with: Tape Dental Injury: Teeth and Oropharynx as per pre-operative assessment

## 2016-12-09 NOTE — Discharge Instructions (Signed)
Walk As Tolerated utilizing back precautions.  No bending, twisting, or lifting.  No driving for 2 weeks.   °Aquacel dressing may remain in place until follow up. May shower with aquacel dressing in place. If the dressing peels off or becomes saturated, you may remove aquacel dressing and place gauze and tape dressing which should be kept clean and dry and changed daily. Do not remove steri-strips if they are present. °See Dr. Brysan Mcevoy in office in 10 to 14 days. Begin taking aspirin 81mg per day starting 4 days after your surgery if not allergic to aspirin or on another blood thinner. °Walk daily even outside. Use a cane or walker only if necessary. °Avoid sitting on soft sofas. ° °

## 2016-12-09 NOTE — Transfer of Care (Signed)
Immediate Anesthesia Transfer of Care Note  Patient: Darren Woods  Procedure(s) Performed: Procedure(s) with comments: Revision microlumbar decompression L5-S1 Right (Right) - 2 hrs  Patient Location: PACU  Anesthesia Type:General  Level of Consciousness:  sedated, patient cooperative and responds to stimulation  Airway & Oxygen Therapy:Patient Spontanous Breathing and Patient connected to face mask oxgen  Post-op Assessment:  Report given to PACU RN and Post -op Vital signs reviewed and stable  Post vital signs:  Reviewed and stable  Last Vitals:  Vitals:   12/09/16 0913  BP: 118/68  Pulse: 66  Resp: 16  Temp: 35.0 C    Complications: No apparent anesthesia complications

## 2016-12-09 NOTE — Brief Op Note (Signed)
12/09/2016  1:12 PM  PATIENT:  Darren Woods  46 y.o. male  PRE-OPERATIVE DIAGNOSIS:  Recurrent HNP stenosis L5-S1 right  POST-OPERATIVE DIAGNOSIS:  Recurrent HNP stenosis L5-S1 right  PROCEDURE:  Procedure(s) with comments: Revision microlumbar decompression L5-S1 Right (Right) - 2 hrs  SURGEON:  Surgeon(s) and Role:    Susa Day, MD - Primary  PHYSICIAN ASSISTANT:   ASSISTANTSMancel Bale   ANESTHESIA:   general  EBL:  Total I/O In: 1000 [I.V.:1000] Out: 50 [Blood:50]  BLOOD ADMINISTERED:none  DRAINS: none   LOCAL MEDICATIONS USED:  MARCAINE     SPECIMEN:  Source of Specimen:  L5S1  DISPOSITION OF SPECIMEN:  PATHOLOGY  COUNTS:  YES  TOURNIQUET:  * No tourniquets in log *  DICTATION: .Other Dictation: Dictation Number [id number]  PLAN OF CARE: Admit for overnight observation  PATIENT DISPOSITION:  PACU - hemodynamically stable.   Delay start of Pharmacological VTE agent (>24hrs) due to surgical blood loss or risk of bleeding: yes

## 2016-12-10 ENCOUNTER — Encounter (HOSPITAL_COMMUNITY): Payer: Self-pay | Admitting: Specialist

## 2016-12-10 DIAGNOSIS — M5117 Intervertebral disc disorders with radiculopathy, lumbosacral region: Secondary | ICD-10-CM | POA: Diagnosis not present

## 2016-12-10 NOTE — Op Note (Signed)
NAMEMarland Kitchen  Darren Woods, Darren Woods NO.:  [id number]  MEDICAL RECORD NO.:  [id number]  LOCATION:                                 FACILITY:  PHYSICIAN:  Susa Day, M.D.         DATE OF BIRTH:  DATE OF PROCEDURE:  12/09/2016 DATE OF DISCHARGE:                              OPERATIVE REPORT   PREOPERATIVE DIAGNOSES: 1. Spinal stenosis, recurrent disk herniation, L5-S1, right. 2. Elevated body mass index of 40.  POSTOPERATIVE DIAGNOSES: 1. Spinal stenosis, recurrent disk herniation, L5-S1, right. 2. Elevated body mass index of 40.  PROCEDURE PERFORMED: 1. Revision microlumbar decompression, L5-S1, right. 2. Foraminotomies, L5-S1, right. 3. Microdiskectomy, L5-S1, right. Technical difficulty increased due to the patient's elevated BMI of 40.  HISTORY:  This is a 46 year old, history of lumbar decompression L5-S1 on the right and a work injury, recurrent disk herniation, refractory to conservative treatment, indicated for revision microlumbar decompression L5-S1.  He had multilevel disk degeneration, elevated BMI.  He had good and probably leg transposed the back pain.  Discussed revision microlumbar decompression.  Risks and benefits discussed including bleeding, infection, damage to the neurovascular structures, no change in symptoms, worsening symptoms, DVT, PE, anesthetic complications, etc. The patient also had a history of MRSA, although tested negative preoperatively and undergo preoperative decolonization and felt prudent to add vancomycin preoperatively for prophylaxis.  TECHNIQUE:  The patient in supine position, after induction of adequate general anesthesia, an IV vancomycin placed prone on the [place] frame. All bony prominences were well padded.  Lumbar region was prepped and draped in usual sterile fashion.  Additional time taken to position the patient due to the elevated BMI.  After lumbar region was prepped and draped in usual sterile fashion.  We  utilized the previous surgical scar and incisions.  Subcutaneous tissue was dissected.  Electrocautery was utilized to achieve hemostasis.  McCulloch retractor was required to expose the fascia due to the ample subcutaneous adipose layer.  Marcaine 0.25% with epinephrine was then infiltrated in the perimuscular tissues. This was divided on either side of the spinous process, L5-S1.  The paraspinous musculature elevated from L5-S1.  Extra long retractors were placed.  Operating microscope was draped and brought on the surgical field.  We skeletonized the previous hemilaminotomy with a straight curette, mobilizing detached epidural fibrosis from the cephalad edge of S1.  Placed a neuro patty beneath and performed a hemilaminotomy of S1 identifying the S1 nerve root and ample foraminotomy of S1 was performed as well.  Decompressed the lateral recess to the medial border of the pedicle and enlarged the hemilaminotomy of caudad edge of L5 as well, preserving the pars.  I meticulously developed a plane between the epidural fibrosis in the previous facet.  Mobilized the S1 nerve root and performed a foraminotomy of L5.  After isolating and protecting the S1 nerve root, a focal disk herniation was noted.  I performed an annulotomy and copious portion of disk material was removed from the disk space with a straight upbiting pituitary.  This further irrigated with catheter lavage.  Additional fragments retrieved.  Following this, a neural probe passed freely up the foramen of L5 and  S1 and beneath the pedicle of L5.  Without neural compression, there was 1 cm of excursion in the S1 nerve root and meeting the pedicle without tension.  No evidence of CSF leakage or active bleeding.  Bipolar electrocautery was utilized to achieve hemostasis.  A thrombin-soaked Gelfoam was placed in laminotomy defect.  Given the disk herniation seen, the epidural fibrosis, the patient's size, and a localization film  performed, we are at the disk space at L5-S1, rather than wait for x-ray.  We removed the [facility] retractor, closed the dorsolumbar fascia, 1 Vicryl subcu with multiple 2-0 layers and skin with staples.  Wound was dressed sterilely. He was placed supine on the hospital bed, extubated without difficulty, and transported to the recovery room in satisfactory condition.  The patient tolerated the procedure well.  No complications.  Assistant, Nehemiah Massed, PA, was used throughout the case for patient positioning, gentle intermittent neural traction, closure, etc.  Minimal blood loss.     Susa Day, M.D.   ______________________________ Susa Day, M.D.    Geralynn Rile  D:  12/09/2016  T:  12/09/2016  Job:  [id number]

## 2016-12-10 NOTE — Discharge Summary (Signed)
Patient ID: Darren Woods MRN: [id number] DOB/AGE: 12-20-1970 46 y.o.  Admit date: 12/09/2016 Discharge date: 12/10/2016  Admission Diagnoses:  Active Problems:   HNP (herniated nucleus pulposus), lumbar   Discharge Diagnoses:  Same  Past Medical History:  Diagnosis Date  . Anxiety   . GERD (gastroesophageal reflux disease)    occasional  . PONV (postoperative nausea and vomiting)    with first knee surgery    Surgeries: Procedure(s): Revision microlumbar decompression L5-S1 Right on 12/09/2016   Consultants:   Discharged Condition: Improved  Hospital Course: Darren Woods is an 46 y.o. male who was admitted 12/09/2016 for operative treatment of<principal problem not specified>. Patient has severe unremitting pain that affects sleep, daily activities, and work/hobbies. After pre-op clearance the patient was taken to the operating room on 12/09/2016 and underwent  Procedure(s): Revision microlumbar decompression L5-S1 Right.    Patient was given perioperative antibiotics: Anti-infectives    Start     Dose/Rate Route Frequency Ordered Stop   12/09/16 2200  vancomycin (VANCOCIN) 1,500 mg in sodium chloride 0.9 % 500 mL IVPB     1,500 mg 250 mL/hr over 120 Minutes Intravenous  Once 12/09/16 1556 12/10/16 0004   12/09/16 0600  ceFAZolin (ANCEF) 3 g in dextrose 5 % 50 mL IVPB     3 g 130 mL/hr over 30 Minutes Intravenous  Once 12/08/16 1310 12/09/16 1126   12/09/16 0600  vancomycin (VANCOCIN) 1,500 mg in sodium chloride 0.9 % 500 mL IVPB     1,500 mg 250 mL/hr over 120 Minutes Intravenous On call to O.R. 12/08/16 1310 12/09/16 1237   12/09/16 0000  polymyxin B 500,000 Units, bacitracin 50,000 Units in sodium chloride irrigation 0.9 % 500 mL irrigation  Status:  Discontinued       As needed 12/09/16 1225 12/09/16 1328       Patient was given sequential compression devices, early ambulation, and chemoprophylaxis to prevent DVT.  Patient benefited maximally from hospital  stay and there were no complications.    Recent vital signs: Patient Vitals for the past 24 hrs:  BP Temp Temp src Pulse Resp SpO2 Height Weight  12/10/16 0606 (!) 106/59 97.7 F (36.5 C) Oral 62 16 99 % - -  12/10/16 0146 (!) 104/56 97.5 F (36.4 C) Oral 89 17 98 % - -  12/09/16 2158 116/73 97.7 F (36.5 C) Oral 96 17 97 % - -  12/09/16 1925 113/65 97.9 F (36.6 C) Oral (!) 108 18 95 % - -  12/09/16 1813 110/61 97.6 F (36.4 C) Oral (!) 109 16 96 % - -  12/09/16 1704 - - - - - 97 % - -  12/09/16 1702 119/64 98 F (36.7 C) Oral (!) 101 18 93 % - -  12/09/16 1541 113/61 98.2 F (36.8 C) - 97 16 97 % - -  12/09/16 1500 118/80 97.8 F (36.6 C) - 95 19 95 % - -  12/09/16 1445 110/74 - - 90 14 95 % - -  12/09/16 1430 134/69 - - 82 15 96 % - -  12/09/16 1400 - - - - 15 - - -  12/09/16 1330 (!) 143/68 98 F (36.7 C) - 98 15 100 % - -  12/09/16 0920 - - - - - - 5\' 11"  (1.803 m) 128.8 kg (284 lb)  12/09/16 0913 118/68 97.7 F (36.5 C) Oral 66 16 99 % - -     Recent laboratory studies: No results for input(s): WBC,  HGB, HCT, PLT, NA, K, CL, CO2, BUN, CREATININE, GLUCOSE, INR, CALCIUM in the last 72 hours.  Invalid input(s): PT, 2   Discharge Medications:   Allergies as of 12/10/2016   No Known Allergies     Medication List    TAKE these medications   desvenlafaxine 50 MG 24 hr tablet Commonly known as:  PRISTIQ Take 50 mg by mouth daily.   docusate sodium 100 MG capsule Commonly known as:  COLACE Take 1 capsule (100 mg total) by mouth 2 (two) times daily as needed for mild constipation.   methocarbamol 500 MG tablet Commonly known as:  ROBAXIN Take 1 tablet (500 mg total) by mouth every 6 (six) hours as needed for muscle spasms.   omeprazole 20 MG capsule Commonly known as:  PRILOSEC Take 20 mg by mouth daily before breakfast.   oxyCODONE-acetaminophen 10-325 MG tablet Commonly known as:  PERCOCET Take 1-2 tablets by mouth every 4 (four) hours as needed for pain  (severe).   polyethylene glycol packet Commonly known as:  MIRALAX / GLYCOLAX Take 17 g by mouth daily.       Diagnostic Studies: Dg Lumbar Spine 2-3 Views  Result Date: 12/04/2016 CLINICAL DATA:  Preoperative evaluation.  Long-term lumbago EXAM: LUMBAR SPINE - 2-3 VIEW COMPARISON:  Dec 01, 2013. FINDINGS: Upright frontal and upright lateral views obtained. The there are 5 non-rib-bearing lumbar type vertebral bodies. There is thoracolumbar dextroscoliosis. There is no fracture or spondylolisthesis. There is moderate disc space narrowing at L2-3, L3-4, L4-5, and L5-S1. There is also moderate narrowing of the disc at T12-L1. There are small anterior osteophytes at all levels. No erosive change. IMPRESSION: Multilevel disc space narrowing. No fracture or spondylolisthesis. There is thoracolumbar dextroscoliosis. Electronically Signed   By: Lowella Grip III M.D.   On: 12/04/2016 13:23   Dg Spine Portable 1 View  Result Date: 12/09/2016 CLINICAL DATA:  L5-S1 lumbar decompression EXAM: PORTABLE SPINE - 1 VIEW COMPARISON:  None. FINDINGS: Single lateral x-ray of the lumbar spine. Posterior tissue retractors. Metallic probe with the tip projecting just posterior to S1-S2. Metallic clamp tip projects over the inferior margin of the L5 spinous process. IMPRESSION: Intraoperative localization as described above. Electronically Signed   By: Kathreen Devoid   On: 12/09/2016 12:28    Disposition: 01-Home or Self Care  Discharge Instructions    Call MD / Call 911    Complete by:  As directed    If you experience chest pain or shortness of breath, CALL 911 and be transported to the hospital emergency room.  If you develope a fever above 101 F, pus (white drainage) or increased drainage or redness at the wound, or calf pain, call your surgeon's office.   Constipation Prevention    Complete by:  As directed    Drink plenty of fluids.  Prune juice may be helpful.  You may use a stool softener, such as Colace  (over the counter) 100 mg twice a day.  Use MiraLax (over the counter) for constipation as needed.   Diet - low sodium heart healthy    Complete by:  As directed    Increase activity slowly as tolerated    Complete by:  As directed       Follow-up Information    Susa Day, MD In 2 weeks.   Specialty:  Orthopedic Surgery Contact information: [address] [place] [address] [phone]            Signed: Susa Day  C 12/10/2016, 7:23 AM

## 2016-12-10 NOTE — Evaluation (Signed)
Physical Therapy One Time Evaluation Patient Details Name: Darren Woods MRN: [id number] DOB: 1970-11-21 Today's Date: 12/10/2016   History of Present Illness  Pt is a 46 year old male s/p Revision microlumbar decompression L5-S1 Right  Clinical Impression  Patient evaluated by Physical Therapy with no further acute PT needs identified. All education has been completed and the patient has no further questions.  Pt able to recall all back precautions.  Pt ambulated and performed steps with SPC.  Pt would benefit from [facility] at home upon d/c due to R knee pain as well as s/p back surgery (pt states cane eases knee pain and allows him to feel more stable).  PT is signing off. Thank you for this referral.     Follow Up Recommendations No PT follow up    Equipment Recommendations  Cane    Recommendations for Other Services       Precautions / Restrictions Precautions Precautions: Back Precaution Comments: pt able to recall all back precautions      Mobility  Bed Mobility Overal bed mobility: Needs Assistance Bed Mobility: Rolling;Sidelying to Sit;Sit to Sidelying Rolling: Modified independent (Device/Increase time) Sidelying to sit: Modified independent (Device/Increase time)     Sit to sidelying: Modified independent (Device/Increase time) General bed mobility comments: pt recalled log roll technique without cues  Transfers Overall transfer level: Modified independent Equipment used: Straight cane             General transfer comment: slow transfer but maintained precautions  Ambulation/Gait Ambulation/Gait assistance: Supervision Ambulation Distance (Feet): 280 Feet Assistive device: Straight cane Gait Pattern/deviations: Step-through pattern;Decreased stance time - right;Antalgic     General Gait Details: pt reporting R knee pain so educated on safe and correct use of SPC, pt able to use correctly and states cane helps with alleviating pain, declined RW, cues  for back precautions  Stairs Stairs: Yes Stairs assistance: Supervision Stair Management: One rail Left;Forwards;Step to pattern;With cane Number of Stairs: 4 General stair comments: verbal cues for sequence and cane positioning, pt reports understanding and performed well  Wheelchair Mobility    Modified Rankin (Stroke Patients Only)       Balance                                             Pertinent Vitals/Pain Pain Assessment: 0-10 Pain Score: 5  Pain Location: back Pain Descriptors / Indicators: Sore Pain Intervention(s): Limited activity within patient's tolerance;Monitored during session;RN gave pain meds during session;Repositioned    Home Living Family/patient expects to be discharged to:: Private residence Living Arrangements: Spouse/significant other Available Help at Discharge: Family;Available 24 hours/day Type of Home: House Home Access: Stairs to enter Entrance Stairs-Rails: Left Entrance Stairs-Number of Steps: 3 Home Layout: One level Home Equipment: None      Prior Function Level of Independence: Independent               Hand Dominance        Extremity/Trunk Assessment        Lower Extremity Assessment Lower Extremity Assessment: RLE deficits/detail RLE Deficits / Details: pt reports presurgical symptoms of tingling and R LE weakness, pt reports mild tingling at present, reports R knee pain (pt reports he injured R LE and his back at work)       Communication   Communication: No difficulties  Cognition Arousal/Alertness: Awake/alert Behavior During  Therapy: WFL for tasks assessed/performed Overall Cognitive Status: Within Functional Limits for tasks assessed                                        General Comments      Exercises     Assessment/Plan    PT Assessment Patent does not need any further PT services  PT Problem List         PT Treatment Interventions      PT Goals  (Current goals can be found in the Care Plan section)  Acute Rehab PT Goals PT Goal Formulation: All assessment and education complete, DC therapy    Frequency     Barriers to discharge        Co-evaluation               AM-PAC PT "6 Clicks" Daily Activity  Outcome Measure Difficulty turning over in bed (including adjusting bedclothes, sheets and blankets)?: None Difficulty moving from lying on back to sitting on the side of the bed? : None Difficulty sitting down on and standing up from a chair with arms (e.g., wheelchair, bedside commode, etc,.)?: A Little Help needed moving to and from a bed to chair (including a wheelchair)?: A Little Help needed walking in hospital room?: A Little Help needed climbing 3-5 steps with a railing? : A Little 6 Click Score: 20    End of Session   Activity Tolerance: Patient tolerated treatment well Patient left: in bed;with call bell/phone within reach;with nursing/sitter in room Nurse Communication: Mobility status PT Visit Diagnosis: Difficulty in walking, not elsewhere classified (R26.2)    Time: 0350-0938 PT Time Calculation (min) (ACUTE ONLY): 10 min   Charges:   PT Evaluation $PT Eval Low Complexity: 1 Procedure     PT G Codes:   PT G-Codes **NOT FOR INPATIENT CLASS** Functional Assessment Tool Used: AM-PAC 6 Clicks Basic Mobility;Clinical judgement Functional Limitation: Mobility: Walking and moving around Mobility: Walking and Moving Around Current Status (H8299): At least 20 percent but less than 40 percent impaired, limited or restricted Mobility: Walking and Moving Around Goal Status [phone]): At least 1 percent but less than 20 percent impaired, limited or restricted Mobility: Walking and Moving Around Discharge Status [phone]): At least 1 percent but less than 20 percent impaired, limited or restricted    Carmelia Bake, PT, DPT 12/10/2016 Pager: [phone]   York Ram E 12/10/2016, 12:49 PM

## 2016-12-10 NOTE — Progress Notes (Signed)
    Durable Medical Equipment        Start     Ordered   12/10/16 0920  For home use only DME Cane  Once     12/10/16 0919    [phone]

## 2016-12-10 NOTE — Evaluation (Signed)
Occupational Therapy Evaluation Patient Details Name: Darren Woods MRN: [id number] DOB: 1971/02/13 Today's Date: 12/10/2016    History of Present Illness s/p L5-S1 due to recurrent stenosis.  Previous back sx in '15   Clinical Impression   This 46 year old man was admitted for the above sx. All education was completed. No further OT is needed at this time    Follow Up Recommendations  No OT follow up    Equipment Recommendations  3 in 1 bedside commode    Recommendations for Other Services       Precautions / Restrictions Precautions Precautions: Back Restrictions Weight Bearing Restrictions: No      Mobility Bed Mobility Overal bed mobility: Needs Assistance Bed Mobility: Supine to Sit     Supine to sit: Supervision     General bed mobility comments: one cue for sidelying to sit  Transfers Overall transfer level: Independent                    Balance                                           ADL either performed or assessed with clinical judgement   ADL Overall ADL's : Needs assistance/impaired     Grooming: Oral care;Supervision/safety;Standing   Upper Body Bathing: Set up;Sitting   Lower Body Bathing: Minimal assistance;Sit to/from stand   Upper Body Dressing : Set up;Sitting   Lower Body Dressing: Maximal assistance;Sit to/from stand   Toilet Transfer: Supervision/safety;Ambulation (to chair)   Toileting- Clothing Manipulation and Hygiene: Supervision/safety;Sit to/from stand         General ADL Comments: pt has a low commode at home.  He also has knee pain and would benefit from 3:1 over commode. Educated on AE.  He has a long sponge at home. Reviewed back precautions and ADLs.      Vision         Perception     Praxis      Pertinent Vitals/Pain Pain Assessment: Faces Faces Pain Scale: Hurts a little bit Pain Location: back Pain Descriptors / Indicators: Sore Pain Intervention(s): Limited  activity within patient's tolerance;Monitored during session;Premedicated before session;Repositioned     Hand Dominance     Extremity/Trunk Assessment Upper Extremity Assessment Upper Extremity Assessment: Overall WFL for tasks assessed           Communication Communication Communication: No difficulties   Cognition Arousal/Alertness: Awake/alert Behavior During Therapy: WFL for tasks assessed/performed Overall Cognitive Status: Within Functional Limits for tasks assessed                                     General Comments       Exercises     Shoulder Instructions      Home Living Family/patient expects to be discharged to:: Private residence Living Arrangements: Spouse/significant other Available Help at Discharge: Family;Available 24 hours/day               Bathroom Shower/Tub: Occupational psychologist: Standard     Home Equipment: None          Prior Functioning/Environment Level of Independence: Independent                 OT Problem List:  OT Treatment/Interventions:      OT Goals(Current goals can be found in the care plan section) Acute Rehab OT Goals Patient Stated Goal: return to independence OT Goal Formulation: All assessment and education complete, DC therapy  OT Frequency:     Barriers to D/C:            Co-evaluation              AM-PAC PT "6 Clicks" Daily Activity     Outcome Measure Help from another person eating meals?: None Help from another person taking care of personal grooming?: A Little Help from another person toileting, which includes using toliet, bedpan, or urinal?: A Little Help from another person bathing (including washing, rinsing, drying)?: A Little Help from another person to put on and taking off regular upper body clothing?: A Little Help from another person to put on and taking off regular lower body clothing?: A Lot 6 Click Score: 18   End of Session     Activity Tolerance: Patient tolerated treatment well Patient left: in chair;with call bell/phone within reach  OT Visit Diagnosis: Muscle weakness (generalized) (M62.81)                Time: 1638-4536 OT Time Calculation (min): 19 min Charges:  OT General Charges $OT Visit: 1 Procedure OT Evaluation $OT Eval Low Complexity: 1 Procedure G-Codes: OT G-codes **NOT FOR INPATIENT CLASS** Functional Assessment Tool Used: Clinical judgement Functional Limitation: Self care Self Care Current Status (I6803): At least 40 percent but less than 60 percent impaired, limited or restricted Self Care Goal Status (O1224): At least 40 percent but less than 60 percent impaired, limited or restricted Self Care Discharge Status [phone]): At least 40 percent but less than 60 percent impaired, limited or restricted   Lesle Chris, OTR/L [phone] 12/10/2016  Anushri Casalino 12/10/2016, 8:45 AM

## 2016-12-10 NOTE — Progress Notes (Signed)
Subjective: 1 Day Post-Op Procedure(s) (LRB): Revision microlumbar decompression L5-S1 Right (Right) Patient reports pain as 4 on 0-10 scale.   Leg pain much better Objective: Vital signs in last 24 hours: Temp:  [97.5 F (36.4 C)-98.2 F (36.8 C)] 97.7 F (36.5 C) (06/07 0606) Pulse Rate:  [62-109] 62 (06/07 0606) Resp:  [14-19] 16 (06/07 0606) BP: (104-143)/(56-80) 106/59 (06/07 0606) SpO2:  [93 %-100 %] 99 % (06/07 0606) Weight:  [128.8 kg (284 lb)] 128.8 kg (284 lb) (06/06 0920)  Intake/Output from previous day: 06/06 0701 - 06/07 0700 In: 2835 [P.O.:1080; I.V.:1200; IV Piggyback:555] Out: 9371 [Urine:1700; Blood:50] Intake/Output this shift: No intake/output data recorded.  No results for input(s): HGB in the last 72 hours. No results for input(s): WBC, RBC, HCT, PLT in the last 72 hours. No results for input(s): NA, K, CL, CO2, BUN, CREATININE, GLUCOSE, CALCIUM in the last 72 hours. No results for input(s): LABPT, INR in the last 72 hours.  Neurologically intact Sensation intact distally Intact pulses distally Dorsiflexion/Plantar flexion intact Incision: dressing C/D/I  Assessment/Plan: 1 Day Post-Op Procedure(s) (LRB): Revision microlumbar decompression L5-S1 Right (Right) Advance diet Up with therapy D/C IV fluids Discharge home with home health  Ersa Delaney C 12/10/2016, 7:16 AM

## 2016-12-14 NOTE — Anesthesia Postprocedure Evaluation (Signed)
Anesthesia Post Note  Patient: Darren Woods  Procedure(s) Performed: Procedure(s) (LRB): Revision microlumbar decompression L5-S1 Right (Right)     Patient location during evaluation: PACU Anesthesia Type: General Level of consciousness: awake and alert and patient cooperative Pain management: pain level controlled Vital Signs Assessment: post-procedure vital signs reviewed and stable Respiratory status: spontaneous breathing and respiratory function stable Cardiovascular status: stable Anesthetic complications: no    Last Vitals:  Vitals:   12/10/16 0146 12/10/16 0606  BP: (!) 104/56 (!) 106/59  Pulse: 89 62  Resp: 17 16  Temp: 36.4 C 36.5 C    Last Pain:  Vitals:   12/10/16 1340  TempSrc:   PainSc: [place]

## 2017-10-12 ENCOUNTER — Ambulatory Visit: Payer: Self-pay | Admitting: Orthopedic Surgery

## 2017-10-25 ENCOUNTER — Encounter (HOSPITAL_COMMUNITY): Payer: Self-pay

## 2017-10-25 NOTE — Patient Instructions (Signed)
Your procedure is scheduled on: Wednesday, Nov 03, 2017   Surgery Time:  8:30AM-11:00AM   Report to [facility] Main  Entrance    Report to admitting at 6:00 AM   Call this number if you have problems the morning of surgery [phone]   Do not eat food or drink liquids :After Midnight.   Do NOT smoke after Midnight   Take these medicines the morning of surgery with A SIP OF WATER: Escitalopram, Omeprazole                               You may not have any metal on your body including jewelry, and body piercings             Do not wear lotions, powders, perfumes/cologne, or deodorant                           Men may shave face and neck.   Do not bring valuables to the hospital. [place].   Contacts, dentures or bridgework may not be worn into surgery.   Leave suitcase in the car. After surgery it may be brought to your room.   Special Instructions: Bring a copy of your healthcare power of attorney and living will documents         the day of surgery if you haven't scanned them in before.              Please read over the following fact sheets you were given:  [facility] - Preparing for Surgery Before surgery, you can play an important role.  Because skin is not sterile, your skin needs to be as free of germs as possible.  You can reduce the number of germs on your skin by washing with CHG (chlorahexidine gluconate) soap before surgery.  CHG is an antiseptic cleaner which kills germs and bonds with the skin to continue killing germs even after washing. Please DO NOT use if you have an allergy to CHG or antibacterial soaps.  If your skin becomes reddened/irritated stop using the CHG and inform your nurse when you arrive at Short Stay. Do not shave (including legs and underarms) for at least 48 hours prior to the first CHG shower.  You may shave your face/neck.  Please follow these instructions carefully:  1.   Shower with CHG Soap the night before surgery and the  morning of surgery.  2.  If you choose to wash your hair, wash your hair first as usual with your normal  shampoo.  3.  After you shampoo, rinse your hair and body thoroughly to remove the shampoo.                             4.  Use CHG as you would any other liquid soap.  You can apply chg directly to the skin and wash.  Gently with a scrungie or clean washcloth.  5.  Apply the CHG Soap to your body ONLY FROM THE NECK DOWN.   Do   not use on face/ open                           Wound or  open sores. Avoid contact with eyes, ears mouth and   genitals (private parts).                       Wash face,  Genitals (private parts) with your normal soap.             6.  Wash thoroughly, paying special attention to the area where your    surgery  will be performed.  7.  Thoroughly rinse your body with warm water from the neck down.  8.  DO NOT shower/wash with your normal soap after using and rinsing off the CHG Soap.                9.  Pat yourself dry with a clean towel.            10.  Wear clean pajamas.            11.  Place clean sheets on your bed the night of your first shower and do not  sleep with pets. Day of Surgery : Do not apply any lotions/deodorants the morning of surgery.  Please wear clean clothes to the hospital/surgery center.  FAILURE TO FOLLOW THESE INSTRUCTIONS MAY RESULT IN THE CANCELLATION OF YOUR SURGERY  PATIENT SIGNATURE_________________________________  NURSE SIGNATURE__________________________________  ________________________________________________________________________   Darren Woods  An incentive spirometer is a tool that can help keep your lungs clear and active. This tool measures how well you are filling your lungs with each breath. Taking long deep breaths may help reverse or decrease the chance of developing breathing (pulmonary) problems (especially infection) following:  A long period of  time when you are unable to move or be active. BEFORE THE PROCEDURE   If the spirometer includes an indicator to show your best effort, your nurse or respiratory therapist will set it to a desired goal.  If possible, sit up straight or lean slightly forward. Try not to slouch.  Hold the incentive spirometer in an upright position. INSTRUCTIONS FOR USE  1. Sit on the edge of your bed if possible, or sit up as far as you can in bed or on a chair. 2. Hold the incentive spirometer in an upright position. 3. Breathe out normally. 4. Place the mouthpiece in your mouth and seal your lips tightly around it. 5. Breathe in slowly and as deeply as possible, raising the piston or the ball toward the top of the column. 6. Hold your breath for 3-5 seconds or for as long as possible. Allow the piston or ball to fall to the bottom of the column. 7. Remove the mouthpiece from your mouth and breathe out normally. 8. Rest for a few seconds and repeat Steps 1 through 7 at least 10 times every 1-2 hours when you are awake. Take your time and take a few normal breaths between deep breaths. 9. The spirometer may include an indicator to show your best effort. Use the indicator as a goal to work toward during each repetition. 10. After each set of 10 deep breaths, practice coughing to be sure your lungs are clear. If you have an incision (the cut made at the time of surgery), support your incision when coughing by placing a pillow or rolled up towels firmly against it. Once you are able to get out of bed, walk around indoors and cough well. You may stop using the incentive spirometer when instructed by your caregiver.  RISKS AND COMPLICATIONS  Take your time so you  do not get dizzy or light-headed.  If you are in pain, you may need to take or ask for pain medication before doing incentive spirometry. It is harder to take a deep breath if you are having pain. AFTER USE  Rest and breathe slowly and easily.  It can  be helpful to keep track of a log of your progress. Your caregiver can provide you with a simple table to help with this. If you are using the spirometer at home, follow these instructions: [place] IF:   You are having difficultly using the spirometer.  You have trouble using the spirometer as often as instructed.  Your pain medication is not giving enough relief while using the spirometer.  You develop fever of 100.5 F (38.1 C) or higher. SEEK IMMEDIATE MEDICAL CARE IF:   You cough up bloody sputum that had not been present before.  You develop fever of 102 F (38.9 C) or greater.  You develop worsening pain at or near the incision site. MAKE SURE YOU:   Understand these instructions.  Will watch your condition.  Will get help right away if you are not doing well or get worse. Document Released: 11/02/2006 Document Revised: 09/14/2011 Document Reviewed: 01/03/2007 [facility] Patient Information 2014 [place], [place].   ________________________________________________________________________

## 2017-10-26 ENCOUNTER — Encounter (HOSPITAL_COMMUNITY)
Admission: RE | Admit: 2017-10-26 | Discharge: 2017-10-26 | Disposition: A | Discharge status: Home / Self Care | Payer: BLUE CROSS/BLUE SHIELD | Source: Ambulatory Visit | Attending: Specialist | Admitting: Specialist

## 2017-10-26 ENCOUNTER — Other Ambulatory Visit: Payer: Self-pay

## 2017-10-26 ENCOUNTER — Encounter (HOSPITAL_COMMUNITY): Payer: Self-pay

## 2017-10-26 DIAGNOSIS — Z01812 Encounter for preprocedural laboratory examination: Secondary | ICD-10-CM | POA: Insufficient documentation

## 2017-10-26 HISTORY — DX: Unspecified osteoarthritis, unspecified site: M19.90

## 2017-10-26 HISTORY — DX: Obesity, unspecified: E66.9

## 2017-10-26 LAB — CBC
HCT: 50.3 % (ref 39.0–52.0)
HEMOGLOBIN: 16.7 g/dL (ref 13.0–17.0)
MCH: 31.4 pg (ref 26.0–34.0)
MCHC: 33.2 g/dL (ref 30.0–36.0)
MCV: 94.5 fL (ref 78.0–100.0)
Platelets: 270 10*3/uL (ref 150–400)
RBC: 5.32 MIL/uL (ref 4.22–5.81)
RDW: 13.3 % (ref 11.5–15.5)
WBC: 9.9 10*3/uL (ref 4.0–10.5)

## 2017-10-26 LAB — SURGICAL PCR SCREEN
MRSA, PCR: NEGATIVE
Staphylococcus aureus: NEGATIVE

## 2017-10-26 LAB — URINALYSIS, ROUTINE W REFLEX MICROSCOPIC
Bilirubin Urine: NEGATIVE
Glucose, UA: NEGATIVE mg/dL
Hgb urine dipstick: NEGATIVE
KETONES UR: NEGATIVE mg/dL
LEUKOCYTES UA: NEGATIVE
NITRITE: NEGATIVE
PH: 6 (ref 5.0–8.0)
Protein, ur: NEGATIVE mg/dL
SPECIFIC GRAVITY, URINE: 1.02 (ref 1.005–1.030)

## 2017-10-26 LAB — BASIC METABOLIC PANEL
ANION GAP: 12 (ref 5–15)
BUN: 20 mg/dL (ref 6–20)
CHLORIDE: 100 mmol/L — AB (ref 101–111)
CO2: 27 mmol/L (ref 22–32)
Calcium: 9.3 mg/dL (ref 8.9–10.3)
Creatinine, Ser: 0.83 mg/dL (ref 0.61–1.24)
GFR calc non Af Amer: >60 mL/min (ref >60)
Glucose, Bld: 89 mg/dL (ref 65–99)
POTASSIUM: 4.5 mmol/L (ref 3.5–5.1)
Sodium: 139 mmol/L (ref 135–145)

## 2017-10-26 LAB — PROTIME-INR
INR: 0.93
PROTHROMBIN TIME: 12.4 s (ref 11.4–15.2)

## 2017-10-26 LAB — APTT: aPTT: 30 seconds (ref 24–36)

## 2017-10-26 LAB — MAGNESIUM: Magnesium: 1.8 mg/dL (ref 1.7–2.4)

## 2017-10-26 NOTE — Pre-Procedure Instructions (Signed)
The following are in chart: EKG 10/25/2017 Surgical clearance Dr. Jeralene Huff 10/25/2017

## 2017-10-28 ENCOUNTER — Ambulatory Visit: Payer: Self-pay | Admitting: Orthopedic Surgery

## 2017-10-28 NOTE — H&P (View-Only) (Signed)
Darren Woods is an 47 y.o. male.   Chief Complaint: R knee pain HPI: History & Physical. Right total knee replacement scheduled for 11/03/17 at [facility] by Dr. Tonita Cong.  Darren Woods reports chronic pain in the right knee progressively worsening over time and refractory to conservative treatment including injection therapy both with cortisone and Visco supplementation as well as arthroscopy and physical therapy. He reports weightbearing pain, swelling, popping and instability, pain that limits his activities of daily living and quality-of-life at this point. He desires to proceed with surgery.  Dr. Tonita Cong and the patient mutually agreed to proceed with a right total knee replacement. Risks and benefits of the procedure were discussed including stiffness, suboptimal range of motion, persistent pain, infection requiring removal of prosthesis and reinsertion, need for prophylactic antibiotics in the future, for example, dental procedures, possible need for manipulation, revision in the future and also anesthetic complications including DVT, PE, etc. We discussed the perioperative course, time in the hospital, postoperative recovery and the need for elevation to control swelling. We also discussed the predicted range of motion and the probability that squatting and kneeling would be unobtainable in the future. In addition, postoperative anticoagulation was discussed. We have obtained preoperative medical clearance as necessary. Provided illustrated handout and discussed it in detail. They will enroll in the total joint replacement educational forum at the hospital.  Past Medical History:  Diagnosis Date  . Anxiety   . Arthritis   . GERD (gastroesophageal reflux disease)    occasional  . Obesity   . PONV (postoperative nausea and vomiting)    with first knee surgery, no problems since this procedure    Past Surgical History:  Procedure Laterality Date  . CHOLECYSTECTOMY  1994  . KNEE  ARTHROSCOPY Left 1989  . KNEE ARTHROSCOPY Right 06/01/2016  . LUMBAR LAMINECTOMY/DECOMPRESSION MICRODISCECTOMY N/A 12/06/2013   Procedure: Decompression L5-S1,L2-L3, removal of lipoma at L2-L3, microdiscectomy at S1 ;  Surgeon: [name], MD;  Location: [facility];  Service: Orthopedics;  Laterality: N/A;  . LUMBAR LAMINECTOMY/DECOMPRESSION MICRODISCECTOMY Right 12/09/2016   Procedure: Revision microlumbar decompression L5-S1 Right;  Surgeon: Susa Day, MD;  Location: [facility];  Service: Orthopedics;  Laterality: Right;  Marland Kitchen VASECTOMY      No family history on file. Social History:  reports that he has never smoked. He has never used smokeless tobacco. He reports that he drinks alcohol. He reports that he does not use drugs.  Smoking Status: Never smoker Non-smoker Chewing tobacco: none Alcohol intake: Occasional Hand Dominance: Right Work related injury?: Y Advance directive: N Medical Power of Attorney: Y  Allergies: No Known Allergies  Medications Antiseptic Skin Cleanser (chlorhexidine) 4 % liquid APPLY 1 APPLICATION(S) EVERY DAY BY TOPICAL ROUTE FOR 7 DAYS. escitalopram 20 mg tablet TAKE 1 TABLET BY MOUTH EVERY DAY ibuprofen mupirocin 2 % topical ointment APPLY INSIDE EACH NOSTRIL TWICE DAILY FOR SEVEN DAYS PRIOR TO SURGERY omeprazole 20 mg capsule,delayed release TAKE 1 CAPSULE BY MOUTH EVERY DAY  (Not in a hospital admission)  No results found for this or any previous visit (from the past 48 hour(s)). No results found.  Review of Systems  Constitutional: Negative.   HENT: Negative.   Eyes: Negative.   Respiratory: Negative.   Cardiovascular: Negative.   Gastrointestinal: Negative.   Genitourinary: Negative.   Musculoskeletal: Positive for back pain and joint pain.  Skin: Negative.   Neurological: Negative.     There were no vitals taken for this visit. Physical Exam  Constitutional: He is oriented to person, place, and time.  obese  HENT:  Head:  Normocephalic.  Eyes: Pupils are equal, round, and reactive to light.  Neck: Normal range of motion.  Cardiovascular: Normal rate.  Respiratory: Effort normal.  GI: Soft.  Musculoskeletal:  He has an antalgic gait. No rashes about the right knee or lower leg. No calf pain or sign of DVT. Tender to palpation medial and lateral joint line. Range of motion approximately 0-110.  Neurological: He is alert and oriented to person, place, and time.  Skin: Skin is warm and dry.     Assessment/Plan R knee osteoarthritis  Plan: Pt with end-stage Right knee DJD, bone-on-bone, refractory to conservative tx, scheduled for Right total knee replacement by Dr. Tonita Cong on May 1. We again discussed the procedure itself as well as risks, complications and alternatives, including but not limited to DVT, PE, infx, bleeding, failure of procedure, need for secondary procedure including manipulation, nerve injury, ongoing pain/symptoms, anesthesia risk, even stroke or death. Also discussed typical post-op protocols, activity restrictions, need for PT, flexion/extension exercises, time out of work. Discussed need for DVT ppx post-op per protocol. Discussed dental ppx and infx prevention. Also discussed limitations post-operatively such as kneeling and squatting. All questions were answered. Patient desires to proceed with surgery as scheduled.  Will hold supplements, ASA and NSAIDs accordingly. Will remain NPO after midnight the night before surgery. Will present to [facility] for pre-op testing. Anticipate hospital stay to include at least 2 midnights given medical history and to ensure proper pain control. Plan ASA 325mg  BID for DVT ppx post-op. Plan Percocet, Robaxin, Colace, Miralax. Plan home with HHPT post-op with family members at home for assistance. Will follow up 10-14 days post-op for staple removal and xrays. He did test negative for MRSA.  Cecilie Kicks., PA-C for Dr. Tonita Cong 10/28/2017, 3:19 PM

## 2017-10-28 NOTE — H&P (Signed)
Darren Woods is an 47 y.o. male.   Chief Complaint: R knee pain HPI: History & Physical. Right total knee replacement scheduled for 11/03/17 at [facility] by Dr. Tonita Cong.  [name] reports chronic pain in the right knee progressively worsening over time and refractory to conservative treatment including injection therapy both with cortisone and Visco supplementation as well as arthroscopy and physical therapy. He reports weightbearing pain, swelling, popping and instability, pain that limits his activities of daily living and quality-of-life at this point. He desires to proceed with surgery.  Dr. Tonita Cong and the patient mutually agreed to proceed with a right total knee replacement. Risks and benefits of the procedure were discussed including stiffness, suboptimal range of motion, persistent pain, infection requiring removal of prosthesis and reinsertion, need for prophylactic antibiotics in the future, for example, dental procedures, possible need for manipulation, revision in the future and also anesthetic complications including DVT, PE, etc. We discussed the perioperative course, time in the hospital, postoperative recovery and the need for elevation to control swelling. We also discussed the predicted range of motion and the probability that squatting and kneeling would be unobtainable in the future. In addition, postoperative anticoagulation was discussed. We have obtained preoperative medical clearance as necessary. Provided illustrated handout and discussed it in detail. They will enroll in the total joint replacement educational forum at the hospital.  Past Medical History:  Diagnosis Date  . Anxiety   . Arthritis   . GERD (gastroesophageal reflux disease)    occasional  . Obesity   . PONV (postoperative nausea and vomiting)    with first knee surgery, no problems since this procedure    Past Surgical History:  Procedure Laterality Date  . CHOLECYSTECTOMY  1994  . KNEE  ARTHROSCOPY Left 1989  . KNEE ARTHROSCOPY Right 06/01/2016  . LUMBAR LAMINECTOMY/DECOMPRESSION MICRODISCECTOMY N/A 12/06/2013   Procedure: Decompression L5-S1,L2-L3, removal of lipoma at L2-L3, microdiscectomy at S1 ;  Surgeon: [name], MD;  Location: [facility];  Service: Orthopedics;  Laterality: N/A;  . LUMBAR LAMINECTOMY/DECOMPRESSION MICRODISCECTOMY Right 12/09/2016   Procedure: Revision microlumbar decompression L5-S1 Right;  Surgeon: Susa Day, MD;  Location: [facility];  Service: Orthopedics;  Laterality: Right;  Marland Kitchen VASECTOMY      No family history on file. Social History:  reports that he has never smoked. He has never used smokeless tobacco. He reports that he drinks alcohol. He reports that he does not use drugs.  Smoking Status: Never smoker Non-smoker Chewing tobacco: none Alcohol intake: Occasional Hand Dominance: Right Work related injury?: Y Advance directive: N Medical Power of Attorney: Y  Allergies: No Known Allergies  Medications Antiseptic Skin Cleanser (chlorhexidine) 4 % liquid APPLY 1 APPLICATION(S) EVERY DAY BY TOPICAL ROUTE FOR 7 DAYS. escitalopram 20 mg tablet TAKE 1 TABLET BY MOUTH EVERY DAY ibuprofen mupirocin 2 % topical ointment APPLY INSIDE EACH NOSTRIL TWICE DAILY FOR SEVEN DAYS PRIOR TO SURGERY omeprazole 20 mg capsule,delayed release TAKE 1 CAPSULE BY MOUTH EVERY DAY  (Not in a hospital admission)  No results found for this or any previous visit (from the past 48 hour(s)). No results found.  Review of Systems  Constitutional: Negative.   HENT: Negative.   Eyes: Negative.   Respiratory: Negative.   Cardiovascular: Negative.   Gastrointestinal: Negative.   Genitourinary: Negative.   Musculoskeletal: Positive for back pain and joint pain.  Skin: Negative.   Neurological: Negative.     There were no vitals taken for this visit. Physical Exam  Constitutional: He is oriented to person, place, and time.  obese  HENT:  Head:  Normocephalic.  Eyes: Pupils are equal, round, and reactive to light.  Neck: Normal range of motion.  Cardiovascular: Normal rate.  Respiratory: Effort normal.  GI: Soft.  Musculoskeletal:  He has an antalgic gait. No rashes about the right knee or lower leg. No calf pain or sign of DVT. Tender to palpation medial and lateral joint line. Range of motion approximately 0-110.  Neurological: He is alert and oriented to person, place, and time.  Skin: Skin is warm and dry.     Assessment/Plan R knee osteoarthritis  Plan: Pt with end-stage Right knee DJD, bone-on-bone, refractory to conservative tx, scheduled for Right total knee replacement by Dr. Tonita Cong on May 1. We again discussed the procedure itself as well as risks, complications and alternatives, including but not limited to DVT, PE, infx, bleeding, failure of procedure, need for secondary procedure including manipulation, nerve injury, ongoing pain/symptoms, anesthesia risk, even stroke or death. Also discussed typical post-op protocols, activity restrictions, need for PT, flexion/extension exercises, time out of work. Discussed need for DVT ppx post-op per protocol. Discussed dental ppx and infx prevention. Also discussed limitations post-operatively such as kneeling and squatting. All questions were answered. Patient desires to proceed with surgery as scheduled.  Will hold supplements, ASA and NSAIDs accordingly. Will remain NPO after midnight the night before surgery. Will present to [facility] for pre-op testing. Anticipate hospital stay to include at least 2 midnights given medical history and to ensure proper pain control. Plan ASA 325mg  BID for DVT ppx post-op. Plan Percocet, Robaxin, Colace, Miralax. Plan home with HHPT post-op with family members at home for assistance. Will follow up 10-14 days post-op for staple removal and xrays. He did test negative for MRSA.  Cecilie Kicks., PA-C for Dr. Tonita Cong 10/28/2017, 3:19 PM

## 2017-11-02 MED ORDER — TRANEXAMIC ACID 1000 MG/10ML IV SOLN
1000.0000 mg | INTRAVENOUS | Status: AC
  Administered 2017-11-03: 1000 mg via INTRAVENOUS
  Filled 2017-11-02: qty 1100

## 2017-11-02 MED ORDER — DEXTROSE 5 % IV SOLN
3.0000 g | INTRAVENOUS | Status: AC
  Administered 2017-11-03: 3 g via INTRAVENOUS
  Filled 2017-11-02: qty 3

## 2017-11-03 ENCOUNTER — Inpatient Hospital Stay (HOSPITAL_COMMUNITY): Payer: BLUE CROSS/BLUE SHIELD | Admitting: Registered Nurse

## 2017-11-03 ENCOUNTER — Other Ambulatory Visit: Payer: Self-pay

## 2017-11-03 ENCOUNTER — Encounter (HOSPITAL_COMMUNITY): Payer: Self-pay | Admitting: Emergency Medicine

## 2017-11-03 ENCOUNTER — Inpatient Hospital Stay (HOSPITAL_COMMUNITY): Payer: BLUE CROSS/BLUE SHIELD

## 2017-11-03 ENCOUNTER — Inpatient Hospital Stay (HOSPITAL_COMMUNITY)
Admission: RE | Admit: 2017-11-03 | Discharge: 2017-11-07 | DRG: 470 | Disposition: A | Discharge status: Home Health | Payer: BLUE CROSS/BLUE SHIELD | Source: Ambulatory Visit | Attending: Specialist | Admitting: Specialist

## 2017-11-03 ENCOUNTER — Encounter (HOSPITAL_COMMUNITY)
Admission: RE | Disposition: A | Discharge status: Home Health | Payer: Self-pay | Source: Ambulatory Visit | Attending: Specialist

## 2017-11-03 DIAGNOSIS — Z8614 Personal history of Methicillin resistant Staphylococcus aureus infection: Secondary | ICD-10-CM | POA: Diagnosis not present

## 2017-11-03 DIAGNOSIS — Z96651 Presence of right artificial knee joint: Secondary | ICD-10-CM

## 2017-11-03 DIAGNOSIS — G8929 Other chronic pain: Secondary | ICD-10-CM | POA: Diagnosis present

## 2017-11-03 DIAGNOSIS — M1711 Unilateral primary osteoarthritis, right knee: Secondary | ICD-10-CM | POA: Diagnosis present

## 2017-11-03 DIAGNOSIS — Z6841 Body Mass Index (BMI) 40.0 and over, adult: Secondary | ICD-10-CM

## 2017-11-03 DIAGNOSIS — Z9049 Acquired absence of other specified parts of digestive tract: Secondary | ICD-10-CM

## 2017-11-03 DIAGNOSIS — M1731 Unilateral post-traumatic osteoarthritis, right knee: Secondary | ICD-10-CM | POA: Diagnosis present

## 2017-11-03 DIAGNOSIS — K219 Gastro-esophageal reflux disease without esophagitis: Secondary | ICD-10-CM | POA: Diagnosis present

## 2017-11-03 DIAGNOSIS — Z96659 Presence of unspecified artificial knee joint: Secondary | ICD-10-CM

## 2017-11-03 DIAGNOSIS — R55 Syncope and collapse: Secondary | ICD-10-CM | POA: Diagnosis not present

## 2017-11-03 HISTORY — PX: TOTAL KNEE ARTHROPLASTY: SHX125

## 2017-11-03 SURGERY — ARTHROPLASTY, KNEE, TOTAL
Anesthesia: General | Site: Knee | Laterality: Right

## 2017-11-03 MED ORDER — SODIUM CHLORIDE 0.9 % IV SOLN
INTRAVENOUS | Status: DC | PRN
  Administered 2017-11-03: 500 mL

## 2017-11-03 MED ORDER — LIDOCAINE 2% (20 MG/ML) 5 ML SYRINGE
INTRAMUSCULAR | Status: AC
  Filled 2017-11-03: qty 5

## 2017-11-03 MED ORDER — LIDOCAINE-EPINEPHRINE (PF) 1 %-1:200000 IJ SOLN
INTRAMUSCULAR | Status: DC | PRN
  Administered 2017-11-03 (×2): 15 mL

## 2017-11-03 MED ORDER — KCL IN DEXTROSE-NACL 20-5-0.45 MEQ/L-%-% IV SOLN
INTRAVENOUS | Status: AC
  Administered 2017-11-03: 16:00:00 via INTRAVENOUS
  Filled 2017-11-03: qty 1000

## 2017-11-03 MED ORDER — FENTANYL CITRATE (PF) 100 MCG/2ML IJ SOLN
INTRAMUSCULAR | Status: AC
  Filled 2017-11-03: qty 2

## 2017-11-03 MED ORDER — POLYETHYLENE GLYCOL 3350 17 G PO PACK: 17.0000 g | PACK | Freq: Every day | ORAL | 0 refills | Status: AC

## 2017-11-03 MED ORDER — BISACODYL 5 MG PO TBEC
5.0000 mg | DELAYED_RELEASE_TABLET | Freq: Every day | ORAL | Status: DC | PRN
  Filled 2017-11-03: qty 1

## 2017-11-03 MED ORDER — SODIUM CHLORIDE 0.9 % IV SOLN
1500.0000 mg | Freq: Once | INTRAVENOUS | Status: AC
  Administered 2017-11-03: 1500 mg via INTRAVENOUS
  Filled 2017-11-03: qty 1500

## 2017-11-03 MED ORDER — MIDAZOLAM HCL 2 MG/2ML IJ SOLN
INTRAMUSCULAR | Status: AC
  Filled 2017-11-03: qty 2

## 2017-11-03 MED ORDER — BUPIVACAINE-EPINEPHRINE (PF) 0.5% -1:200000 IJ SOLN
INTRAMUSCULAR | Status: AC
  Filled 2017-11-03: qty 30

## 2017-11-03 MED ORDER — ESCITALOPRAM OXALATE 20 MG PO TABS
20.0000 mg | ORAL_TABLET | Freq: Every day | ORAL | Status: DC
  Administered 2017-11-04 – 2017-11-07 (×4): 20 mg via ORAL
  Filled 2017-11-03 (×4): qty 1

## 2017-11-03 MED ORDER — METOCLOPRAMIDE HCL 5 MG/ML IJ SOLN: 5.0000 mg | Freq: Three times a day (TID) | INTRAMUSCULAR | Status: DC | PRN

## 2017-11-03 MED ORDER — EPHEDRINE SULFATE 50 MG/ML IJ SOLN
INTRAMUSCULAR | Status: DC | PRN
  Administered 2017-11-03 (×2): 10 mg via INTRAVENOUS

## 2017-11-03 MED ORDER — DOCUSATE SODIUM 100 MG PO CAPS
100.0000 mg | ORAL_CAPSULE | Freq: Two times a day (BID) | ORAL | Status: DC
  Administered 2017-11-03 – 2017-11-07 (×8): 100 mg via ORAL
  Filled 2017-11-03 (×8): qty 1

## 2017-11-03 MED ORDER — SODIUM CHLORIDE 0.9 % IR SOLN
Status: DC | PRN
  Administered 2017-11-03: 1000 mL

## 2017-11-03 MED ORDER — ONDANSETRON HCL 4 MG/2ML IJ SOLN: 4.0000 mg | Freq: Four times a day (QID) | INTRAMUSCULAR | Status: DC | PRN

## 2017-11-03 MED ORDER — SUGAMMADEX SODIUM 500 MG/5ML IV SOLN
INTRAVENOUS | Status: DC | PRN
  Administered 2017-11-03: 300 mg via INTRAVENOUS

## 2017-11-03 MED ORDER — ASPIRIN EC 325 MG PO TBEC
325.0000 mg | DELAYED_RELEASE_TABLET | Freq: Two times a day (BID) | ORAL | Status: DC
  Administered 2017-11-04 – 2017-11-07 (×7): 325 mg via ORAL
  Filled 2017-11-03 (×7): qty 1

## 2017-11-03 MED ORDER — METHOCARBAMOL 1000 MG/10ML IJ SOLN
500.0000 mg | Freq: Four times a day (QID) | INTRAVENOUS | Status: DC | PRN
  Administered 2017-11-03: 500 mg via INTRAVENOUS
  Filled 2017-11-03: qty 550

## 2017-11-03 MED ORDER — BUPIVACAINE HCL (PF) 0.25 % IJ SOLN
INTRAMUSCULAR | Status: AC
  Filled 2017-11-03: qty 60

## 2017-11-03 MED ORDER — METHOCARBAMOL 500 MG PO TABS: 500.0000 mg | ORAL_TABLET | Freq: Four times a day (QID) | ORAL | Status: DC | PRN

## 2017-11-03 MED ORDER — GLYCOPYRROLATE 0.2 MG/ML IJ SOLN
INTRAMUSCULAR | Status: DC | PRN
  Administered 2017-11-03: 0.2 mg via INTRAVENOUS

## 2017-11-03 MED ORDER — ACETAMINOPHEN 10 MG/ML IV SOLN
1000.0000 mg | INTRAVENOUS | Status: AC
  Administered 2017-11-03: 1000 mg via INTRAVENOUS

## 2017-11-03 MED ORDER — PANTOPRAZOLE SODIUM 40 MG PO TBEC
40.0000 mg | DELAYED_RELEASE_TABLET | Freq: Every day | ORAL | Status: DC
  Administered 2017-11-04 – 2017-11-07 (×4): 40 mg via ORAL
  Filled 2017-11-03 (×4): qty 1

## 2017-11-03 MED ORDER — SODIUM CHLORIDE 0.9 % IJ SOLN
INTRAMUSCULAR | Status: AC
  Filled 2017-11-03: qty 10

## 2017-11-03 MED ORDER — VANCOMYCIN HCL 10 G IV SOLR
1250.0000 mg | Freq: Two times a day (BID) | INTRAVENOUS | Status: AC
  Administered 2017-11-03: 1250 mg via INTRAVENOUS
  Filled 2017-11-03: qty 1250

## 2017-11-03 MED ORDER — MAGNESIUM CITRATE PO SOLN
1.0000 | Freq: Once | ORAL | Status: DC | PRN
  Filled 2017-11-03: qty 296

## 2017-11-03 MED ORDER — STERILE WATER FOR IRRIGATION IR SOLN
Status: DC | PRN
  Administered 2017-11-03: 2000 mL

## 2017-11-03 MED ORDER — LIDOCAINE HCL (CARDIAC) PF 100 MG/5ML IV SOSY
PREFILLED_SYRINGE | INTRAVENOUS | Status: DC | PRN
  Administered 2017-11-03: 75 mg via INTRAVENOUS
  Administered 2017-11-03: 25 mg via INTRATRACHEAL

## 2017-11-03 MED ORDER — ATROPINE SULFATE 1 MG/ML IJ SOLN
INTRAMUSCULAR | Status: DC | PRN
  Administered 2017-11-03 (×2): 0.2 mg via INTRAVENOUS

## 2017-11-03 MED ORDER — MENTHOL 3 MG MT LOZG: 1.0000 | LOZENGE | OROMUCOSAL | Status: DC | PRN

## 2017-11-03 MED ORDER — HYDROMORPHONE HCL 1 MG/ML IJ SOLN
1.0000 mg | INTRAMUSCULAR | Status: DC | PRN
  Administered 2017-11-03 – 2017-11-04 (×5): 1 mg via INTRAVENOUS
  Filled 2017-11-03 (×4): qty 1

## 2017-11-03 MED ORDER — FENTANYL CITRATE (PF) 100 MCG/2ML IJ SOLN
INTRAMUSCULAR | Status: DC | PRN
  Administered 2017-11-03: 100 ug via INTRAVENOUS

## 2017-11-03 MED ORDER — HYDROMORPHONE HCL 1 MG/ML IJ SOLN
INTRAMUSCULAR | Status: AC
  Administered 2017-11-03: 1 mg via INTRAVENOUS
  Filled 2017-11-03: qty 2

## 2017-11-03 MED ORDER — DEXAMETHASONE SODIUM PHOSPHATE 10 MG/ML IJ SOLN
INTRAMUSCULAR | Status: AC
  Filled 2017-11-03: qty 1

## 2017-11-03 MED ORDER — SODIUM CHLORIDE 0.9 % IV SOLN
INTRAVENOUS | Status: AC
  Filled 2017-11-03: qty 500000

## 2017-11-03 MED ORDER — ROPIVACAINE HCL 7.5 MG/ML IJ SOLN
INTRAMUSCULAR | Status: DC | PRN
  Administered 2017-11-03: 20 mL via PERINEURAL

## 2017-11-03 MED ORDER — ASPIRIN 325 MG PO TABS
325.0000 mg | ORAL_TABLET | Freq: Every day | ORAL | Status: DC
  Filled 2017-11-03: qty 1

## 2017-11-03 MED ORDER — PROMETHAZINE HCL 25 MG/ML IJ SOLN: 6.2500 mg | INTRAMUSCULAR | Status: DC | PRN

## 2017-11-03 MED ORDER — ONDANSETRON HCL 4 MG/2ML IJ SOLN
INTRAMUSCULAR | Status: AC
  Filled 2017-11-03: qty 2

## 2017-11-03 MED ORDER — HYDROMORPHONE HCL 2 MG/ML IJ SOLN
INTRAMUSCULAR | Status: AC
  Filled 2017-11-03: qty 1

## 2017-11-03 MED ORDER — HYDROMORPHONE HCL 1 MG/ML IJ SOLN: 0.2500 mg | INTRAMUSCULAR | Status: DC | PRN

## 2017-11-03 MED ORDER — OXYCODONE HCL 5 MG PO TABS
5.0000 mg | ORAL_TABLET | ORAL | Status: DC | PRN
  Administered 2017-11-03 – 2017-11-07 (×21): 10 mg via ORAL
  Filled 2017-11-03 (×21): qty 2

## 2017-11-03 MED ORDER — ACETAMINOPHEN 10 MG/ML IV SOLN
INTRAVENOUS | Status: AC
  Filled 2017-11-03: qty 100

## 2017-11-03 MED ORDER — SUFENTANIL CITRATE 50 MCG/ML IV SOLN
INTRAVENOUS | Status: DC | PRN
  Administered 2017-11-03 (×2): 5 ug via INTRAVENOUS
  Administered 2017-11-03 (×3): 10 ug via INTRAVENOUS
  Administered 2017-11-03 (×2): 5 ug via INTRAVENOUS

## 2017-11-03 MED ORDER — ONDANSETRON HCL 4 MG/2ML IJ SOLN
INTRAMUSCULAR | Status: DC | PRN
  Administered 2017-11-03: 4 mg via INTRAVENOUS

## 2017-11-03 MED ORDER — HYDROMORPHONE HCL 1 MG/ML IJ SOLN
INTRAMUSCULAR | Status: DC | PRN
  Administered 2017-11-03 (×2): 1 mg via INTRAVENOUS

## 2017-11-03 MED ORDER — TAMSULOSIN HCL 0.4 MG PO CAPS
0.4000 mg | ORAL_CAPSULE | Freq: Every day | ORAL | Status: DC
  Administered 2017-11-03 – 2017-11-06 (×4): 0.4 mg via ORAL
  Filled 2017-11-03 (×4): qty 1

## 2017-11-03 MED ORDER — POLYETHYLENE GLYCOL 3350 17 G PO PACK
17.0000 g | PACK | Freq: Every day | ORAL | Status: DC | PRN
  Administered 2017-11-06: 17 g via ORAL
  Filled 2017-11-03 (×3): qty 1

## 2017-11-03 MED ORDER — METHOCARBAMOL 500 MG PO TABS: 500.0000 mg | ORAL_TABLET | Freq: Four times a day (QID) | ORAL | 1 refills | Status: AC | PRN

## 2017-11-03 MED ORDER — PHENOL 1.4 % MT LIQD: 1.0000 | OROMUCOSAL | Status: DC | PRN

## 2017-11-03 MED ORDER — SUGAMMADEX SODIUM 500 MG/5ML IV SOLN
INTRAVENOUS | Status: AC
  Filled 2017-11-03: qty 5

## 2017-11-03 MED ORDER — LIDOCAINE-EPINEPHRINE (PF) 1 %-1:200000 IJ SOLN
INTRAMUSCULAR | Status: AC
  Filled 2017-11-03: qty 30

## 2017-11-03 MED ORDER — OXYCODONE HCL 5 MG PO TABS: 5.0000 mg | ORAL_TABLET | ORAL | 0 refills | Status: AC | PRN

## 2017-11-03 MED ORDER — ALUM & MAG HYDROXIDE-SIMETH 200-200-20 MG/5ML PO SUSP
30.0000 mL | ORAL | Status: DC | PRN
  Filled 2017-11-03: qty 30

## 2017-11-03 MED ORDER — SUFENTANIL CITRATE 50 MCG/ML IV SOLN
INTRAVENOUS | Status: AC
  Filled 2017-11-03: qty 1

## 2017-11-03 MED ORDER — DOCUSATE SODIUM 100 MG PO CAPS: 100.0000 mg | ORAL_CAPSULE | Freq: Two times a day (BID) | ORAL | 2 refills | Status: AC | PRN

## 2017-11-03 MED ORDER — DIPHENHYDRAMINE HCL 12.5 MG/5ML PO ELIX
12.5000 mg | ORAL_SOLUTION | ORAL | Status: DC | PRN
  Administered 2017-11-05: 25 mg via ORAL
  Filled 2017-11-03 (×2): qty 10

## 2017-11-03 MED ORDER — METOCLOPRAMIDE HCL 5 MG PO TABS
5.0000 mg | ORAL_TABLET | Freq: Three times a day (TID) | ORAL | Status: DC | PRN
  Filled 2017-11-03: qty 2

## 2017-11-03 MED ORDER — METHOCARBAMOL 1000 MG/10ML IJ SOLN
500.0000 mg | Freq: Four times a day (QID) | INTRAMUSCULAR | Status: DC | PRN
  Filled 2017-11-03: qty 5

## 2017-11-03 MED ORDER — ASPIRIN 325 MG PO TABS: 325.0000 mg | ORAL_TABLET | Freq: Two times a day (BID) | ORAL | 1 refills | Status: AC

## 2017-11-03 MED ORDER — LACTATED RINGERS IV SOLN
INTRAVENOUS | Status: DC
  Administered 2017-11-03 (×2): via INTRAVENOUS

## 2017-11-03 MED ORDER — GABAPENTIN 300 MG PO CAPS
300.0000 mg | ORAL_CAPSULE | Freq: Three times a day (TID) | ORAL | Status: DC
  Administered 2017-11-03 – 2017-11-07 (×12): 300 mg via ORAL
  Filled 2017-11-03 (×12): qty 1

## 2017-11-03 MED ORDER — ROCURONIUM BROMIDE 10 MG/ML (PF) SYRINGE
PREFILLED_SYRINGE | INTRAVENOUS | Status: AC
  Filled 2017-11-03: qty 5

## 2017-11-03 MED ORDER — MIDAZOLAM HCL 5 MG/5ML IJ SOLN
INTRAMUSCULAR | Status: DC | PRN
  Administered 2017-11-03: 2 mg via INTRAVENOUS

## 2017-11-03 MED ORDER — ONDANSETRON HCL 4 MG PO TABS
4.0000 mg | ORAL_TABLET | Freq: Four times a day (QID) | ORAL | Status: DC | PRN
Start: 2017-11-03 — End: 2017-11-07
  Filled 2017-11-03: qty 1

## 2017-11-03 MED ORDER — ROCURONIUM BROMIDE 100 MG/10ML IV SOLN
INTRAVENOUS | Status: DC | PRN
  Administered 2017-11-03: 60 mg via INTRAVENOUS

## 2017-11-03 MED ORDER — METHOCARBAMOL 500 MG PO TABS
500.0000 mg | ORAL_TABLET | Freq: Four times a day (QID) | ORAL | Status: DC | PRN
  Administered 2017-11-03 – 2017-11-07 (×8): 500 mg via ORAL
  Filled 2017-11-03 (×11): qty 1

## 2017-11-03 MED ORDER — DEXAMETHASONE SODIUM PHOSPHATE 10 MG/ML IJ SOLN
INTRAMUSCULAR | Status: DC | PRN
  Administered 2017-11-03: 10 mg via INTRAVENOUS

## 2017-11-03 MED ORDER — PROPOFOL 10 MG/ML IV BOLUS
INTRAVENOUS | Status: AC
  Filled 2017-11-03: qty 60

## 2017-11-03 MED ORDER — PHENYLEPHRINE HCL 10 MG/ML IJ SOLN
INTRAMUSCULAR | Status: DC | PRN
  Administered 2017-11-03 (×2): 80 ug via INTRAVENOUS

## 2017-11-03 MED ORDER — PROPOFOL 10 MG/ML IV BOLUS
INTRAVENOUS | Status: DC | PRN
  Administered 2017-11-03: 250 mg via INTRAVENOUS
  Administered 2017-11-03: 30 mg via INTRAVENOUS

## 2017-11-03 MED ORDER — BUPIVACAINE-EPINEPHRINE (PF) 0.5% -1:200000 IJ SOLN
INTRAMUSCULAR | Status: DC | PRN
  Administered 2017-11-03 (×2): 15 mL

## 2017-11-03 MED ORDER — RISAQUAD PO CAPS
1.0000 | ORAL_CAPSULE | Freq: Every day | ORAL | Status: DC
  Administered 2017-11-04 – 2017-11-07 (×4): 1 via ORAL
  Filled 2017-11-03 (×3): qty 1

## 2017-11-03 SURGICAL SUPPLY — 58 items
BAG ZIPLOCK 12X15 (MISCELLANEOUS) ×3 IMPLANT
BANDAGE ACE 4X5 VEL STRL LF (GAUZE/BANDAGES/DRESSINGS) ×3 IMPLANT
BANDAGE ACE 6X5 VEL STRL LF (GAUZE/BANDAGES/DRESSINGS) ×3 IMPLANT
BLADE SAG 18X100X1.27 (BLADE) ×3 IMPLANT
BLADE SAW SGTL 11.0X1.19X90.0M (BLADE) ×3 IMPLANT
BLADE SAW SGTL 13.0X1.19X90.0M (BLADE) ×3 IMPLANT
CAPT KNEE TOTAL 3 ATTUNE ×3 IMPLANT
CEMENT HV SMART SET (Cement) ×6 IMPLANT
CLOTH 2% CHLOROHEXIDINE 3PK (PERSONAL CARE ITEMS) ×3 IMPLANT
COVER SURGICAL LIGHT HANDLE (MISCELLANEOUS) ×3 IMPLANT
CUFF TOURN SGL QUICK 34 (TOURNIQUET CUFF)
CUFF TOURN SGL QUICK 44 (TOURNIQUET CUFF) ×3 IMPLANT
CUFF TRNQT CYL 34X4X40X1 (TOURNIQUET CUFF) IMPLANT
DECANTER SPIKE VIAL GLASS SM (MISCELLANEOUS) ×3 IMPLANT
DRAPE LG THREE QUARTER DISP (DRAPES) ×3 IMPLANT
DRAPE ORTHO SPLIT 77X108 STRL (DRAPES) ×4
DRAPE SHEET LG 3/4 BI-LAMINATE (DRAPES) ×6 IMPLANT
DRAPE SURG ORHT 6 SPLT 77X108 (DRAPES) ×2 IMPLANT
DRAPE U-SHAPE 47X51 STRL (DRAPES) ×3 IMPLANT
DRESSING AQUACEL AG SP 3.5X10 (GAUZE/BANDAGES/DRESSINGS) ×1 IMPLANT
DRSG AQUACEL AG SP 3.5X10 (GAUZE/BANDAGES/DRESSINGS) ×3
DURAPREP 26ML APPLICATOR (WOUND CARE) ×3 IMPLANT
ELECT BLADE TIP CTD 4 INCH (ELECTRODE) ×3 IMPLANT
ELECT REM PT RETURN 15FT ADLT (MISCELLANEOUS) ×3 IMPLANT
EVACUATOR 1/8 PVC DRAIN (DRAIN) IMPLANT
GLOVE BIOGEL PI IND STRL 7.5 (GLOVE) ×3 IMPLANT
GLOVE BIOGEL PI IND STRL 8 (GLOVE) ×3 IMPLANT
GLOVE BIOGEL PI INDICATOR 7.5 (GLOVE) ×6
GLOVE BIOGEL PI INDICATOR 8 (GLOVE) ×6
GLOVE SURG SS PI 7.0 STRL IVOR (GLOVE) ×3 IMPLANT
GLOVE SURG SS PI 7.5 STRL IVOR (GLOVE) ×6 IMPLANT
GLOVE SURG SS PI 8.0 STRL IVOR (GLOVE) ×12 IMPLANT
GOWN SPEC L3 XXLG W/TWL (GOWN DISPOSABLE) ×3 IMPLANT
GOWN STRL REUS W/TWL XL LVL3 (GOWN DISPOSABLE) ×9 IMPLANT
HANDPIECE INTERPULSE COAX TIP (DISPOSABLE) ×2
HEMOSTAT SPONGE AVITENE ULTRA (HEMOSTASIS) ×3 IMPLANT
IMMOBILIZER KNEE 20 (SOFTGOODS) ×3
IMMOBILIZER KNEE 20 THIGH 36 (SOFTGOODS) ×1 IMPLANT
MANIFOLD NEPTUNE II (INSTRUMENTS) ×3 IMPLANT
PACK TOTAL KNEE CUSTOM (KITS) ×3 IMPLANT
POSITIONER SURGICAL ARM (MISCELLANEOUS) ×3 IMPLANT
SEALER BIPOLAR AQUA 6.0 (INSTRUMENTS) ×3 IMPLANT
SET HNDPC FAN SPRY TIP SCT (DISPOSABLE) ×1 IMPLANT
SPONGE SURGIFOAM ABS GEL 100 (HEMOSTASIS) IMPLANT
STAPLER VISISTAT (STAPLE) ×3 IMPLANT
SUT BONE WAX W31G (SUTURE) IMPLANT
SUT MNCRL AB 4-0 PS2 18 (SUTURE) ×3 IMPLANT
SUT STRATAFIX 0 PDS 27 VIOLET (SUTURE) ×3
SUT VIC AB 1 CT1 27 (SUTURE) ×6
SUT VIC AB 1 CT1 27XBRD ANTBC (SUTURE) ×3 IMPLANT
SUT VIC AB 2-0 CT1 27 (SUTURE) ×6
SUT VIC AB 2-0 CT1 TAPERPNT 27 (SUTURE) ×3 IMPLANT
SUTURE STRATFX 0 PDS 27 VIOLET (SUTURE) ×1 IMPLANT
SYR 50ML LL SCALE MARK (SYRINGE) ×3 IMPLANT
TOWER CARTRIDGE SMART MIX (DISPOSABLE) ×3 IMPLANT
TRAY FOLEY W/METER SILVER 16FR (SET/KITS/TRAYS/PACK) ×3 IMPLANT
WRAP KNEE MAXI GEL POST OP (GAUZE/BANDAGES/DRESSINGS) ×3 IMPLANT
YANKAUER SUCT BULB TIP 10FT TU (MISCELLANEOUS) ×3 IMPLANT

## 2017-11-03 NOTE — Anesthesia Procedure Notes (Signed)
Anesthesia Procedure Image    

## 2017-11-03 NOTE — Op Note (Signed)
NAME: Darren Woods, Darren Woods [facility] MEDICAL RECORD [id number] ACCOUNT [id number] DATE OF BIRTH:Sep 09, 1970 FACILITY: [facility] LOCATION: [facility] PHYSICIAN:Elverna Caffee Windy Kalata, MD  OPERATIVE REPORT  DATE OF PROCEDURE:  11/03/2017  PREOPERATIVE DIAGNOSIS:  Osteoarthritis, end-stage of the right knee.  POSTOPERATIVE DIAGNOSIS:  Osteoarthritis, end-stage of the right knee.  PROCEDURE PERFORMED:  Right total knee arthroplasty utilizing DePuy Attune 8 femur, 8 tibia, 6 insert, 41 patella.  ANESTHESIA:  General.  SURGEON:  Susa Day, MD  ASSISTANT:  _____ , PA   HISTORY:  This is a 47 year old male who has had refractory disabling knee pain following an injury. Over 2  years of duration with progressive pain and had failed conservative and operative treatment including rest, activity modification, cortisone injections, viscosupplementation and arthroscopic debridement.  The patient had grade IV changes in the  patellofemoral joint laterally and was given the choices of living with the symptoms versus proceeding with knee arthroplasty.  Given his level of disability, we proceeded with the arthroplasty with the risks and benefits including bleeding, infection,  damage to neurovascular structures, no change in symptoms, worse symptoms, DVT, PE, and anesthetic complications, loosening, need for revision, etc.  TECHNIQUE:  The patient in supine position.  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  Thigh  tourniquet inflated to 225 mmHg.  A midline incision was then made over the knee.  Full thickness flaps developed, median parapatellar arthrotomy performed.  Patella was everted.  Soft tissue was elevated medially and protected the MCL.  Toe everted,  knee flexed.  Grade IV changes over the entire patella and of the femoral sulcus.  Also, a separate grade IV  lesion over the lateral femoral condyle and a grade IV lesion in the posterior medial femoral condyle.    A Leksell was utilized to form a notch  above the intercondylar notch.  The patient had a hypotensive episode that preceded the drilling of the femur.  We had drilled the femur and irrigated it.  Before even insertion of the T-handle over the intramedullary guide, he had a hypotensive episode  that was corrected with atropine.  We paused for approximately 5-10 minutes and then proceeded with irrigation, T-handle reamer 5 degree right.  Ten off the distal femur was selected as 9 did not take much laterally.  This was pinned.  Five degree right.   We performed a distal femoral cut.  Sized the femur to an 8 off the lateral ridge.  This was pinned in 3 degrees of external rotation and the posterior and chamfer cuts were performed.  Soft tissues protected posteriorly with a wide curved Crego.  The  remnants of the medial and lateral menisci were removed.  He had an absent medial meniscus.  Tibia subluxed.  The posterior medial portion was worn as well with some grade IV changes.  External alignment guide was utilized in bisecting the tibiotalar joint.  A 3-degree slope parallel to the shaft, was taken more anteriorly than posteriorly.  This was 10 offthe high side, which was lateral.  This was pinned and we performed our tibial  cut. performing that well.  We then tried our extension block after the external alignment guide was removed and it was fully extended and stable with a 6 degree.  The knee was then reflexed.  Tibia subluxed, sized with an 8, maximizing the tibia, medial  aspect of the  tibial tubercle pinned.  We harvested bone centrally and then drilled it centrally using a punch guide.  We then used a notch guide for the femur bisecting the canal, performed our cut.  We then impacted the trial of the femur and drilled  our lug nuts.  Excellent fit was then noted.  Placed a trial 6 insert, reduced  it and had full extension, full flexion, and good stability to varus valgus testing at 0 to 30 degrees, and negative anterior drawer.  The patella was everted, measured at 26,  planed to a 17.  Measured at 41, medializing our peg holes, drilling them after the paddle was parallel to the knee joint.  We then placed a trial patellar insert, reduced it and had excellent patellofemoral tracking.  Removed all instrumentation.   Checked posteriorly.  Remnants of menisci were removed.  Popliteus was intact.  Cauterized our geniculates with Aquamantys as well as the synovium, had a bit of bleeding through the tourniquet when the pressure was raised.  This was cauterized.  Then  flexed the knee, dried all surfaces thoroughly after pulsatile lavage cleaned the knee.  Mixed cement on the back table in the appropriate fashion.  Then, after drying the tibia, injected in the tibia digitally pressurized it.  We used our 8 tray with  cement on it, impacted it with redundant cement removed, we then cemented and impacted the femur with redundant cement removed.  We placed a trial 6 insert, reduced it to full extension, had an axial load throughout the curing of the cement with  redundant cement removed.  We cemented and clamped the patella.  Marcaine 0.5% with epinephrine was infiltrated in the periosteum about the distal femur and posteriorly for hemostasis.  Filled with antibiotic irrigation and allowed the cement to cure.   Tourniquet was deflated at 70 minutes.  Any bleeding was cauterized which was minimal.  The knee was flexed.  Redundant cement removed.  Removed the insert.  Again, redundant cement removed, copiously irrigated with antibiotic irrigation, felt the 6 was  maximal.  We then placed the 6 permanent insert, reduced it, had full extension, full flexion, good stability with varus valgus stressing at 0 to 30 degrees.  Negative anterior drawer.  Following this, copious irrigation.  No active bleeding.  With   slight flexion to 45 degrees, reapproximated patellar arthrotomy with #1 Vicryl interrupted figure-of-eight sutures and then flexed to further and oversewed it with a running Stratafix.  Following this, we had full flexion, full extension, good stability  with varus valgus stress in 0-30 degrees, negative anterior drawer.  Excellent patellofemoral tracking.  Copious irrigation.  Subcutaneous with 2-0 and skin with staples.  Wound was dressed sterilely.  Prior to that, had flexion to gravity at 90  degrees.  Placed an immobilizer, extubated without difficulty and transported to the recovery room in satisfactory condition.  The patient tolerated procedure well.  No complications.  The assistant ____, PA was used throughout the case for patient positioning, closure and exposure.  The patient in our office had actually had a BMI that was under 40 which was a 39.6.  This was lower than the previous MRI at the hospital, which was 41.  SN/NUANCE  D:11/03/2017 T:11/03/2017 JOB:[id number]

## 2017-11-03 NOTE — Brief Op Note (Signed)
11/03/2017  10:41 AM  PATIENT:  Darren Woods  47 y.o. male  PRE-OPERATIVE DIAGNOSIS:  Degenerative joint disease  POST-OPERATIVE DIAGNOSIS:  Degenerative joint disease  PROCEDURE:  Procedure(s) with comments: RIGHT TOTAL KNEE ARTHROPLASTY (Right) - Adductor Block  SURGEON:  Surgeon(s) and Role:    Susa Day, MD - Primary  PHYSICIAN ASSISTANT:   ASSISTANTS: Bissell   ANESTHESIA:   general  EBL:  200 mL   BLOOD ADMINISTERED:none  DRAINS: none   LOCAL MEDICATIONS USED:  MARCAINE     SPECIMEN:  No Specimen  DISPOSITION OF SPECIMEN:  N/A  COUNTS:  YES  TOURNIQUET:   Total Tourniquet Time Documented: Thigh (Right) - 73 minutes Total: Thigh (Right) - 73 minutes   DICTATION: .Other Dictation: Dictation Number [id number]  PLAN OF CARE: Admit to inpatient   PATIENT DISPOSITION:  PACU - hemodynamically stable.   Delay start of Pharmacological VTE agent (>24hrs) due to surgical blood loss or risk of bleeding: no

## 2017-11-03 NOTE — Discharge Instructions (Signed)

## 2017-11-03 NOTE — Anesthesia Procedure Notes (Signed)
Procedure Name: Intubation Date/Time: 11/03/2017 8:46 AM Performed by: Lissa Morales, CRNA Pre-anesthesia Checklist: Patient identified, Emergency Drugs available, Suction available and Patient being monitored Patient Re-evaluated:Patient Re-evaluated prior to induction Oxygen Delivery Method: Circle system utilized Preoxygenation: Pre-oxygenation with 100% oxygen Induction Type: IV induction Ventilation: Mask ventilation without difficulty Laryngoscope Size: Mac and 4 Grade View: Grade II Tube type: Oral Tube size: 8.0 mm Number of attempts: 1 Airway Equipment and Method: Stylet and Oral airway Placement Confirmation: ETT inserted through vocal cords under direct vision,  positive ETCO2 and breath sounds checked- equal and bilateral Secured at: 22 cm Tube secured with: Tape Dental Injury: Teeth and Oropharynx as per pre-operative assessment

## 2017-11-03 NOTE — Anesthesia Postprocedure Evaluation (Signed)
Anesthesia Post Note  Patient: [name]  Procedure(s) Performed: RIGHT TOTAL KNEE ARTHROPLASTY (Right Knee)     Patient location during evaluation: PACU Anesthesia Type: General Level of consciousness: awake and alert Pain management: pain level controlled Vital Signs Assessment: post-procedure vital signs reviewed and stable Respiratory status: spontaneous breathing, nonlabored ventilation, respiratory function stable and patient connected to nasal cannula oxygen Cardiovascular status: blood pressure returned to baseline and stable Postop Assessment: no apparent nausea or vomiting Anesthetic complications: no    Last Vitals:  Vitals:   11/03/17 1215 11/03/17 1230  BP: 127/74 126/73  Pulse: 89 98  Resp: 18 15  Temp:    SpO2: 93% 95%    Last Pain:  Vitals:   11/03/17 1230  TempSrc:   PainSc: 5                  Shaylie Eklund S

## 2017-11-03 NOTE — Evaluation (Signed)
Physical Therapy Evaluation Patient Details Name: Darren Woods MRN: [id number] DOB: 08-08-1970 Today's Date: 11/03/2017   History of Present Illness  Pt s/p R TKR and with hx of back surgery x2  Clinical Impression  Pt s/p R TKR and presents with decreased R LE strength/ROM and post op pain limiting functional mobility.  Pt should progress to dc home with family assist and HHPT follow up.    Follow Up Recommendations Home health PT    Equipment Recommendations  Rolling walker with 5" wheels    Recommendations for Other Services       Precautions / Restrictions Precautions Precautions: Knee;Fall Required Braces or Orthoses: Knee Immobilizer - Right Knee Immobilizer - Right: Discontinue once straight leg raise with < 10 degree lag Restrictions Weight Bearing Restrictions: No Other Position/Activity Restrictions: WBAT      Mobility  Bed Mobility Overal bed mobility: Needs Assistance Bed Mobility: Supine to Sit     Supine to sit: Min assist     General bed mobility comments: cues for sequence and use of L LE to self assist.  HOB elevated 60 degrees  Transfers Overall transfer level: Needs assistance Equipment used: Rolling walker (2 wheeled) Transfers: Sit to/from Stand Sit to Stand: Min assist;Min guard         General transfer comment: cues for LE management and use of UEs to self assist  Ambulation/Gait Ambulation/Gait assistance: Min guard Ambulation Distance (Feet): 80 Feet(and 15' back from bathroom) Assistive device: Rolling walker (2 wheeled) Gait Pattern/deviations: Step-to pattern;Shuffle;Trunk flexed Gait velocity: decr   General Gait Details: cues for posture, position from RW and initial sequence  Stairs            Wheelchair Mobility    Modified Rankin (Stroke Patients Only)       Balance                                             Pertinent Vitals/Pain Pain Assessment: 0-10 Pain Score: 5  Pain  Location: R knee Pain Descriptors / Indicators: Aching;Sore Pain Intervention(s): Limited activity within patient's tolerance;Monitored during session;Premedicated before session;Ice applied    Home Living Family/patient expects to be discharged to:: Private residence Living Arrangements: Spouse/significant other;Children Available Help at Discharge: Family;Available 24 hours/day Type of Home: House Home Access: Stairs to enter Entrance Stairs-Rails: None Entrance Stairs-Number of Steps: 3 Home Layout: One level Home Equipment: Environmental consultant - 2 wheels      Prior Function Level of Independence: Independent               Hand Dominance        Extremity/Trunk Assessment   Upper Extremity Assessment Upper Extremity Assessment: Overall WFL for tasks assessed    Lower Extremity Assessment Lower Extremity Assessment: RLE deficits/detail    Cervical / Trunk Assessment Cervical / Trunk Assessment: Normal  Communication   Communication: No difficulties  Cognition Arousal/Alertness: Awake/alert Behavior During Therapy: WFL for tasks assessed/performed Overall Cognitive Status: Within Functional Limits for tasks assessed                                        General Comments      Exercises     Assessment/Plan    PT Assessment Patient needs continued PT services  PT Problem  List Decreased strength;Decreased range of motion;Decreased activity tolerance;Decreased mobility;Decreased knowledge of use of DME;Obesity;Pain       PT Treatment Interventions DME instruction;Gait training;Therapeutic activities;Functional mobility training;Stair training;Therapeutic exercise;Patient/family education    PT Goals (Current goals can be found in the Care Plan section)  Acute Rehab PT Goals Patient Stated Goal: Regain IND  PT Goal Formulation: With patient Time For Goal Achievement: 11/10/17 Potential to Achieve Goals: Good    Frequency 7X/week   Barriers to  discharge        Co-evaluation               AM-PAC PT "6 Clicks" Daily Activity  Outcome Measure Difficulty turning over in bed (including adjusting bedclothes, sheets and blankets)?: A Lot Difficulty moving from lying on back to sitting on the side of the bed? : A Lot Difficulty sitting down on and standing up from a chair with arms (e.g., wheelchair, bedside commode, etc,.)?: A Lot Help needed moving to and from a bed to chair (including a wheelchair)?: A Little Help needed walking in hospital room?: A Little Help needed climbing 3-5 steps with a railing? : A Little 6 Click Score: 15    End of Session Equipment Utilized During Treatment: Gait belt;Right knee immobilizer Activity Tolerance: Patient tolerated treatment well Patient left: in chair;with call bell/phone within reach;with chair alarm set Nurse Communication: Mobility status PT Visit Diagnosis: Difficulty in walking, not elsewhere classified (R26.2)    Time: 3790-2409 PT Time Calculation (min) (ACUTE ONLY): 35 min   Charges:   PT Evaluation $PT Eval Low Complexity: 1 Low PT Treatments $Gait Training: 8-22 mins   PT G Codes:        Pg [phone]   Javarian Jakubiak 11/03/2017, 6:14 PM

## 2017-11-03 NOTE — Anesthesia Preprocedure Evaluation (Signed)
Anesthesia Evaluation  Patient identified by MRN, date of birth, ID band Patient awake    Reviewed: Allergy & Precautions, NPO status , Patient's Chart, lab work & pertinent test results  History of Anesthesia Complications (+) PONV  Airway Mallampati: II  TM Distance: <3 FB Neck ROM: Full    Dental no notable dental hx.    Pulmonary neg pulmonary ROS,    Pulmonary exam normal breath sounds clear to auscultation       Cardiovascular negative cardio ROS Normal cardiovascular exam Rhythm:Regular Rate:Normal     Neuro/Psych negative neurological ROS  negative psych ROS   GI/Hepatic Neg liver ROS, GERD  ,  Endo/Other  Morbid obesity  Renal/GU negative Renal ROS  negative genitourinary   Musculoskeletal negative musculoskeletal ROS (+)   Abdominal   Peds negative pediatric ROS (+)  Hematology negative hematology ROS (+)   Anesthesia Other Findings   Reproductive/Obstetrics negative OB ROS                             Anesthesia Physical Anesthesia Plan  ASA: III  Anesthesia Plan: General   Post-op Pain Management:  Regional for Post-op pain   Induction: Intravenous  PONV Risk Score and Plan: 2 and Ondansetron, Dexamethasone and Treatment may vary due to age or medical condition  Airway Management Planned: Oral ETT  Additional Equipment:   Intra-op Plan:   Post-operative Plan: Extubation in OR  Informed Consent: I have reviewed the patients History and Physical, chart, labs and discussed the procedure including the risks, benefits and alternatives for the proposed anesthesia with the patient or authorized representative who has indicated his/her understanding and acceptance.   Dental advisory given  Plan Discussed with: CRNA and Surgeon  Anesthesia Plan Comments:         Anesthesia Quick Evaluation

## 2017-11-03 NOTE — Anesthesia Procedure Notes (Addendum)
Anesthesia Regional Block: Adductor canal block   Pre-Anesthetic Checklist: ,, timeout performed, Correct Patient, Correct Site, Correct Laterality, Correct Procedure, Correct Position, site marked, Risks and benefits discussed,  Surgical consent,  Pre-op evaluation,  At surgeon's request and post-op pain management  Laterality: Right  Prep: chloraprep       Needles:  Injection technique: Single-shot  Needle Type: Echogenic Needle     Needle Length: 9cm      Additional Needles:   Procedures:,,,, ultrasound used (permanent image in chart),,,,  Narrative:  Start time: 11/03/2017 8:08 AM End time: 11/03/2017 8:16 AM Injection made incrementally with aspirations every 5 mL.  Performed by: Personally  Anesthesiologist: Myrtie Soman, MD  Additional Notes: Patient tolerated the procedure well without complications

## 2017-11-03 NOTE — Interval H&P Note (Signed)
History and Physical Interval Note:  11/03/2017 8:24 AM  Darren Woods  has presented today for surgery, with the diagnosis of Degenerative joint disease  The various methods of treatment have been discussed with the patient and family. After consideration of risks, benefits and other options for treatment, the patient has consented to  Procedure(s) with comments: RIGHT TOTAL KNEE ARTHROPLASTY (Right) - 2.5 hrs as a surgical intervention .  The patient's history has been reviewed, patient examined, no change in status, stable for surgery.  I have reviewed the patient's chart and labs.  Questions were answered to the patient's satisfaction.     [name]

## 2017-11-03 NOTE — Transfer of Care (Signed)
Immediate Anesthesia Transfer of Care Note  Patient: Darren Woods  Procedure(s) Performed: RIGHT TOTAL KNEE ARTHROPLASTY (Right Knee)  Patient Location: PACU  Anesthesia Type:General  Level of Consciousness: awake, alert , oriented and patient cooperative  Airway & Oxygen Therapy: Patient Spontanous Breathing and Patient connected to face mask oxygen  Post-op Assessment: Report given to RN, Post -op Vital signs reviewed and stable and Patient moving all extremities X 4  Post vital signs: stable  Last Vitals:  Vitals Value Taken Time  BP 122/66 11/03/2017 11:05 AM  Temp    Pulse 102 11/03/2017 11:14 AM  Resp 12 11/03/2017 11:14 AM  SpO2 99 % 11/03/2017 11:14 AM  Vitals shown include unvalidated device data.  Last Pain:  Vitals:   11/03/17 1105  TempSrc:   PainSc: (P) 6       Patients Stated Pain Goal: 4 (85/02/77 4128)  Complications: No apparent anesthesia complications

## 2017-11-04 LAB — BASIC METABOLIC PANEL
ANION GAP: 9 (ref 5–15)
BUN: 10 mg/dL (ref 6–20)
CHLORIDE: 104 mmol/L (ref 101–111)
CO2: 27 mmol/L (ref 22–32)
Calcium: 8.7 mg/dL — ABNORMAL LOW (ref 8.9–10.3)
Creatinine, Ser: 0.76 mg/dL (ref 0.61–1.24)
GFR calc non Af Amer: >60 mL/min (ref >60)
Glucose, Bld: 125 mg/dL — ABNORMAL HIGH (ref 65–99)
POTASSIUM: 4.7 mmol/L (ref 3.5–5.1)
SODIUM: 140 mmol/L (ref 135–145)

## 2017-11-04 LAB — CBC
HCT: 42.5 % (ref 39.0–52.0)
Hemoglobin: 13.9 g/dL (ref 13.0–17.0)
MCH: 31.1 pg (ref 26.0–34.0)
MCHC: 32.7 g/dL (ref 30.0–36.0)
MCV: 95.1 fL (ref 78.0–100.0)
Platelets: 251 10*3/uL (ref 150–400)
RBC: 4.47 MIL/uL (ref 4.22–5.81)
RDW: 13.3 % (ref 11.5–15.5)
WBC: 14.3 10*3/uL — ABNORMAL HIGH (ref 4.0–10.5)

## 2017-11-04 MED ORDER — ZOLPIDEM TARTRATE 10 MG PO TABS
10.0000 mg | ORAL_TABLET | Freq: Every evening | ORAL | Status: DC | PRN
  Administered 2017-11-04: 10 mg via ORAL
  Filled 2017-11-04: qty 1

## 2017-11-04 NOTE — Progress Notes (Signed)
Subjective: 1 Day Post-Op Procedure(s) (LRB): RIGHT TOTAL KNEE ARTHROPLASTY (Right) Patient reports pain as 3 on 0-10 scale.    Objective: Vital signs in last 24 hours: Temp:  [97.6 F (36.4 C)-98.6 F (37 C)] 98.1 F (36.7 C) (05/02 0558) Pulse Rate:  [69-111] 69 (05/02 0558) Resp:  [8-24] 18 (05/02 0558) BP: (100-131)/(61-83) 120/70 (05/02 0558) SpO2:  [92 %-100 %] 100 % (05/02 0558)  Intake/Output from previous day: 05/01 0701 - 05/02 0700 In: 2547.5 [P.O.:700; I.V.:1792.5; IV Piggyback:55] Out: 3900 [Urine:3700; Blood:200] Intake/Output this shift: No intake/output data recorded.  Recent Labs    11/04/17 0543  HGB 13.9   Recent Labs    11/04/17 0543  WBC 14.3*  RBC 4.47  HCT 42.5  PLT 251   Recent Labs    11/04/17 0543  NA 140  K 4.7  CL 104  CO2 27  BUN 10  CREATININE 0.76  GLUCOSE 125*  CALCIUM 8.7*   No results for input(s): LABPT, INR in the last 72 hours.  Neurologically intact Neurovascular intact Sensation intact distally Intact pulses distally Dorsiflexion/Plantar flexion intact Incision: dressing C/D/I and no drainage  Anticipated LOS equal to or greater than 2 midnights due to - Age 46 and older with one or more of the following:  - Obesity  - Expected need for hospital services (PT, OT, Nursing) required for safe  discharge  - Anticipated need for postoperative skilled nursing care or inpatient rehab  - Active co-morbidities: None OR   - Unanticipated findings during/Post Surgery: Lab abnormalities  - Patient is a high risk of re-admission due to: None   Assessment/Plan: 1 Day Post-Op Procedure(s) (LRB): RIGHT TOTAL KNEE ARTHROPLASTY (Right) Advance diet Up with therapy Plan for discharge tomorrow    Monice Lundy C 11/04/2017, 7:30 AM

## 2017-11-04 NOTE — Progress Notes (Signed)
Physical Therapy Treatment Patient Details Name: Darren Woods MRN: [id number] DOB: 23-Aug-1970 Today's Date: 11/04/2017    History of Present Illness Pt s/p R TKR and with hx of back surgery x2    PT Comments    Pt very motivate and progressing with program despite increased pain this date.   Follow Up Recommendations  Home health PT     Equipment Recommendations  Rolling walker with 5" wheels    Recommendations for Other Services       Precautions / Restrictions Precautions Precautions: Knee;Fall Required Braces or Orthoses: Knee Immobilizer - Right Knee Immobilizer - Right: Discontinue once straight leg raise with < 10 degree lag Restrictions Weight Bearing Restrictions: No Other Position/Activity Restrictions: WBAT    Mobility  Bed Mobility Overal bed mobility: Needs Assistance Bed Mobility: Supine to Sit     Supine to sit: Min assist     General bed mobility comments: cues for sequence and use of L LE to self assist.  HOB elevated 60 degrees  Transfers Overall transfer level: Needs assistance Equipment used: Rolling walker (2 wheeled) Transfers: Sit to/from Stand Sit to Stand: Min guard         General transfer comment: cues for LE management and use of UEs to self assist  Ambulation/Gait Ambulation/Gait assistance: Min guard Ambulation Distance (Feet): 70 Feet(and 15' back from bathroom) Assistive device: Rolling walker (2 wheeled) Gait Pattern/deviations: Step-to pattern;Shuffle;Trunk flexed Gait velocity: decr   General Gait Details: cues for posture, position from RW and initial sequence   Stairs             Wheelchair Mobility    Modified Rankin (Stroke Patients Only)       Balance                                            Cognition Arousal/Alertness: Awake/alert Behavior During Therapy: WFL for tasks assessed/performed Overall Cognitive Status: Within Functional Limits for tasks assessed                                         Exercises Total Joint Exercises Ankle Circles/Pumps: AROM;15 reps;Supine;Both Quad Sets: AROM;Both;10 reps;Supine Heel Slides: AAROM;Right;Supine;15 reps Goniometric ROM: AAROM R knee -10 - 65    General Comments        Pertinent Vitals/Pain Pain Assessment: 0-10 Pain Score: 6 (8/10 with ambulation) Pain Location: R knee Pain Descriptors / Indicators: Aching;Sore Pain Intervention(s): Limited activity within patient's tolerance;Monitored during session;Premedicated before session;Ice applied;Patient requesting pain meds-RN notified    Home Living                      Prior Function            PT Goals (current goals can now be found in the care plan section) Acute Rehab PT Goals Patient Stated Goal: Regain IND  PT Goal Formulation: With patient Time For Goal Achievement: 11/10/17 Potential to Achieve Goals: Good Progress towards PT goals: Progressing toward goals    Frequency    7X/week      PT Plan Current plan remains appropriate    Co-evaluation              AM-PAC PT "6 Clicks" Daily Activity  Outcome Measure  Difficulty turning  over in bed (including adjusting bedclothes, sheets and blankets)?: A Lot Difficulty moving from lying on back to sitting on the side of the bed? : A Lot Difficulty sitting down on and standing up from a chair with arms (e.g., wheelchair, bedside commode, etc,.)?: A Lot Help needed moving to and from a bed to chair (including a wheelchair)?: A Little Help needed walking in hospital room?: A Little Help needed climbing 3-5 steps with a railing? : A Little 6 Click Score: 15    End of Session Equipment Utilized During Treatment: Right knee immobilizer Activity Tolerance: Patient tolerated treatment well Patient left: in chair;with call bell/phone within reach;with chair alarm set Nurse Communication: Mobility status PT Visit Diagnosis: Difficulty in walking, not  elsewhere classified (R26.2)     Time: 9379-0240 PT Time Calculation (min) (ACUTE ONLY): 35 min  Charges:  $Gait Training: 8-22 mins $Therapeutic Exercise: 8-22 mins                    G Codes:       Pg [phone]    Altie Savard 11/04/2017, 8:54 AM

## 2017-11-04 NOTE — Progress Notes (Signed)
CSW Consult-SNF  Plan: Home   No SNF needs Identified. CSW signing off.   Kathrin Greathouse, Latanya Presser, MSW Clinical Social Worker  [phone] 11/04/2017  8:43 AM

## 2017-11-04 NOTE — Discharge Summary (Signed)
Physician Discharge Summary   Patient ID: Darren Woods MRN: [id number] DOB/AGE: 1971-03-30 47 y.o.  Admit date: 11/03/2017 Discharge date: 11/07/17  Primary Diagnosis: right knee post-traumatic osteoarthritis Admission Diagnoses:  Past Medical History:  Diagnosis Date  . Anxiety   . Arthritis   . GERD (gastroesophageal reflux disease)    occasional  . Obesity   . PONV (postoperative nausea and vomiting)    with first knee surgery, no problems since this procedure   Discharge Diagnoses:   Principal Problem:   Primary osteoarthritis of right knee  Estimated body mass index is 41.14 kg/m as calculated from the following:   Height as of this encounter: 5' 11"  (1.803 m).   Weight as of this encounter: 133.8 kg (295 lb).  Procedure:  Procedure(s) (LRB): RIGHT TOTAL KNEE ARTHROPLASTY (Right)   Consults: None  HPI: see H&P Laboratory Data: Admission on 11/03/2017  Component Date Value Ref Range Status  . WBC 11/04/2017 14.3* 4.0 - 10.5 K/uL Final  . RBC 11/04/2017 4.47  4.22 - 5.81 MIL/uL Final  . Hemoglobin 11/04/2017 13.9  13.0 - 17.0 g/dL Final  . HCT 11/04/2017 42.5  39.0 - 52.0 % Final  . MCV 11/04/2017 95.1  78.0 - 100.0 fL Final  . MCH 11/04/2017 31.1  26.0 - 34.0 pg Final  . MCHC 11/04/2017 32.7  30.0 - 36.0 g/dL Final  . RDW 11/04/2017 13.3  11.5 - 15.5 % Final  . Platelets 11/04/2017 251  150 - 400 K/uL Final   Performed at [facility], [place] [address] [place], [place] [address]  . Sodium 11/04/2017 140  135 - 145 mmol/L Final  . Potassium 11/04/2017 4.7  3.5 - 5.1 mmol/L Final  . Chloride 11/04/2017 104  101 - 111 mmol/L Final  . CO2 11/04/2017 27  22 - 32 mmol/L Final  . Glucose, Bld 11/04/2017 125* 65 - 99 mg/dL Final  . BUN 11/04/2017 10  6 - 20 mg/dL Final  . Creatinine, Ser 11/04/2017 0.76  0.61 - 1.24 mg/dL Final  . Calcium 11/04/2017 8.7* 8.9 - 10.3 mg/dL Final  . GFR calc non Af Amer 11/04/2017 >60  >60 mL/min Final  . GFR  calc Af Amer 11/04/2017 >60  >60 mL/min Final   Comment: (NOTE) The eGFR has been calculated using the CKD EPI equation. This calculation has not been validated in all clinical situations. eGFR's persistently <60 mL/min signify possible Chronic Kidney Disease.   Georgiann Hahn gap 11/04/2017 9  5 - 15 Final   Performed at [facility], [place] [address], [place], [place] [address]  Hospital Outpatient Visit on 10/26/2017  Component Date Value Ref Range Status  . aPTT 10/26/2017 30  24 - 36 seconds Final   Performed at [facility], [place] [address], [place], [place] [address]  . Sodium 10/26/2017 139  135 - 145 mmol/L Final  . Potassium 10/26/2017 4.5  3.5 - 5.1 mmol/L Final  . Chloride 10/26/2017 100* 101 - 111 mmol/L Final  . CO2 10/26/2017 27  22 - 32 mmol/L Final  . Glucose, Bld 10/26/2017 89  65 - 99 mg/dL Final  . BUN 10/26/2017 20  6 - 20 mg/dL Final  . Creatinine, Ser 10/26/2017 0.83  0.61 - 1.24 mg/dL Final  . Calcium 10/26/2017 9.3  8.9 - 10.3 mg/dL Final  . GFR calc non Af Amer 10/26/2017 >60  >60 mL/min Final  . GFR calc Af Amer 10/26/2017 >60  >60 mL/min Final  Comment: (NOTE) The eGFR has been calculated using the CKD EPI equation. This calculation has not been validated in all clinical situations. eGFR's persistently <60 mL/min signify possible Chronic Kidney Disease.   Georgiann Hahn gap 10/26/2017 12  5 - 15 Final   Performed at [facility], [place] [address], [place], [place] [address]  . WBC 10/26/2017 9.9  4.0 - 10.5 K/uL Final  . RBC 10/26/2017 5.32  4.22 - 5.81 MIL/uL Final  . Hemoglobin 10/26/2017 16.7  13.0 - 17.0 g/dL Final  . HCT 10/26/2017 50.3  39.0 - 52.0 % Final  . MCV 10/26/2017 94.5  78.0 - 100.0 fL Final  . MCH 10/26/2017 31.4  26.0 - 34.0 pg Final  . MCHC 10/26/2017 33.2  30.0 - 36.0 g/dL Final  . RDW 10/26/2017 13.3  11.5 - 15.5 % Final  . Platelets 10/26/2017 270  150 - 400 K/uL Final   Performed at  [facility], [place] [address] [place], [place] [address]  . Prothrombin Time 10/26/2017 12.4  11.4 - 15.2 seconds Final  . INR 10/26/2017 0.93   Final   Performed at [facility], [place] [address] [place], [place] [address]  . Color, Urine 10/26/2017 YELLOW  YELLOW Final  . APPearance 10/26/2017 CLEAR  CLEAR Final  . Specific Gravity, Urine 10/26/2017 1.020  1.005 - 1.030 Final  . pH 10/26/2017 6.0  5.0 - 8.0 Final  . Glucose, UA 10/26/2017 NEGATIVE  NEGATIVE mg/dL Final  . Hgb urine dipstick 10/26/2017 NEGATIVE  NEGATIVE Final  . Bilirubin Urine 10/26/2017 NEGATIVE  NEGATIVE Final  . Ketones, ur 10/26/2017 NEGATIVE  NEGATIVE mg/dL Final  . Protein, ur 10/26/2017 NEGATIVE  NEGATIVE mg/dL Final  . Nitrite 10/26/2017 NEGATIVE  NEGATIVE Final  . Leukocytes, UA 10/26/2017 NEGATIVE  NEGATIVE Final   Performed at [place] [address], [place], [place] [address]  . Magnesium 10/26/2017 1.8  1.7 - 2.4 mg/dL Final   Performed at [place] [address], [place], [place] [address]  . MRSA, PCR 10/26/2017 NEGATIVE  NEGATIVE Final  . Staphylococcus aureus 10/26/2017 NEGATIVE  NEGATIVE Final   Comment: (NOTE) The Xpert SA Assay (FDA approved for NASAL specimens in patients 32 years of age and older), is one component of a comprehensive surveillance program. It is not intended to diagnose infection nor to guide or monitor treatment. Performed at [facility], [place] [address], [place],  [address]      X-Rays:Dg Knee Right Port  Result Date: 11/03/2017 CLINICAL DATA:  Status post right knee replacement EXAM: PORTABLE RIGHT KNEE - 1-2 VIEW COMPARISON:  None. FINDINGS: Knee prosthesis is noted in satisfactory position. Air is noted in the surgical bed. No acute bony abnormality is seen. IMPRESSION: Status post right knee replacement Electronically Signed   By: Inez Catalina M.D.   On:  11/03/2017 11:43    EKG:No orders found for this or any previous visit.   Hospital Course: Darren Woods is a 47 y.o. who was admitted to [facility]. They were brought to the operating room on 11/03/2017 and underwent Procedure(s): RIGHT TOTAL KNEE ARTHROPLASTY.  Patient tolerated the procedure well and was later transferred to the recovery room and then to the orthopaedic floor for postoperative care.  They were given PO and IV analgesics for pain control following their surgery.  They were given 24 hours of postoperative antibiotics of  Anti-infectives (From admission, onward)  Start     Dose/Rate Route Frequency Ordered Stop   11/03/17 2200  vancomycin (VANCOCIN) 1,250 mg in sodium chloride 0.9 % 250 mL IVPB    Note to Pharmacy:  Per pharmacy   1,250 mg 166.7 mL/hr over 90 Minutes Intravenous Every 12 hours 11/03/17 1113 11/03/17 2237   11/03/17 0915  vancomycin (VANCOCIN) 1,500 mg in sodium chloride 0.9 % 500 mL IVPB     1,500 mg 250 mL/hr over 120 Minutes Intravenous  Once 11/03/17 0900 11/03/17 1145   11/03/17 0903  polymyxin B 500,000 Units, bacitracin 50,000 Units in sodium chloride 0.9 % 500 mL irrigation  Status:  Discontinued       As needed 11/03/17 0929 11/03/17 1101   11/03/17 0600  ceFAZolin (ANCEF) 3 g in dextrose 5 % 50 mL IVPB     3 g 130 mL/hr over 30 Minutes Intravenous On call to O.R. 11/02/17 1203 11/03/17 0910     and started on DVT prophylaxis in the form of Aspirin, TED hose and SCDs.   PT and OT were ordered for total joint protocol.  Discharge planning consulted to help with postop disposition and equipment needs.  Patient had a good night on the evening of surgery.  They started to get up OOB with therapy on day one.  Continued to work with therapy into day two. He did experience a syncopal episode after standing in the morning POD 2. He did not have a fall, he sat down then lost consciousness briefly. This improved following a bolus and was thought to  be orthostatic in nature. By day four post-op, the patient had progressed with therapy and meeting their goals.  Incision was healing well.  Patient was seen in rounds and was ready to go home.   Diet: Regular diet Activity:WBAT Follow-up:in 10-14 days Disposition - Home Discharged Condition: good    Allergies as of 11/04/2017   No Known Allergies     Medication List    TAKE these medications   aspirin 325 MG tablet Take 1 tablet (325 mg total) by mouth 2 (two) times daily. With meals   [employer] ANTISEPTIC SKIN CLEANSER 4 % Soln Generic drug:  Chlorhexidine Gluconate Apply 1 application topically daily.   docusate sodium 100 MG capsule Commonly known as:  COLACE Take 1 capsule (100 mg total) by mouth 2 (two) times daily as needed.   escitalopram 20 MG tablet Commonly known as:  LEXAPRO Take 20 mg by mouth daily.   methocarbamol 500 MG tablet Commonly known as:  ROBAXIN Take 1 tablet (500 mg total) by mouth every 6 (six) hours as needed for muscle spasms.   mupirocin ointment 2 % Commonly known as:  BACTROBAN Place 1 application into the nose 2 (two) times daily.   omeprazole 20 MG capsule Commonly known as:  PRILOSEC Take 20 mg by mouth daily before breakfast.   oxyCODONE 5 MG immediate release tablet Commonly known as:  ROXICODONE Take 1-2 tablets (5-10 mg total) by mouth every 4 (four) hours as needed.   polyethylene glycol packet Commonly known as:  MIRALAX Take 17 g by mouth daily.      Follow-up Information    Susa Day, MD Follow up in 2 week(s).   Specialty:  Orthopedic Surgery Contact information: [address] [place] [address] [phone]           Signed: Lacie Draft, PA-C Orthopaedic Surgery 11/04/2017, 9:57 AM

## 2017-11-04 NOTE — Progress Notes (Signed)
Discharge planning, spoke with patient at bedside. Have chosen [facility] for HH PT, evaluate and treat. Contacted [facility] for referral. Needs RW, contacted [facility] to deliver to room. [phone] 

## 2017-11-04 NOTE — Progress Notes (Signed)
Physical Therapy Treatment Patient Details Name: Darren Woods MRN: [id number] DOB: [birth year] Today's Date: 11/04/2017    History of Present Illness Pt s/p R TKR and with hx of back surgery x2    PT Comments    POD # 1 pm session Applied KI and instructed on use for amb and stairs until able SLR.  Assisted OOB to amb an increased distance however limited due to increased pain this afternoon vs this morning.  Assisted back to bed, removed KI and pt performed 10 HS using a long bed sheet to self assist.  Pt reports 10/10 pain so adjusted TE's and applied ICE.   Follow Up Recommendations  Home health PT     Equipment Recommendations  Rolling walker with 5" wheels    Recommendations for Other Services       Precautions / Restrictions Precautions Precautions: Knee;Fall Precaution Comments: instructed on KI use for amb and stairs until able SLR Required Braces or Orthoses: Knee Immobilizer - Right Knee Immobilizer - Right: Discontinue once straight leg raise with < 10 degree lag Restrictions Weight Bearing Restrictions: No Other Position/Activity Restrictions: WBAT    Mobility  Bed Mobility Overal bed mobility: Needs Assistance Bed Mobility: Supine to Sit     Supine to sit: Min assist     General bed mobility comments: assist R LE off and back onto bed  Transfers Overall transfer level: Needs assistance Equipment used: Rolling walker (2 wheeled) Transfers: Sit to/from Stand Sit to Stand: Min guard;Supervision         General transfer comment: cues for LE management and use of UEs to self assist  Ambulation/Gait Ambulation/Gait assistance: Min guard Ambulation Distance (Feet): 75 Feet Assistive device: Rolling walker (2 wheeled) Gait Pattern/deviations: Step-to pattern;Shuffle;Trunk flexed Gait velocity: decr   General Gait Details: cues for upright posture, position from RW and safety with turns   Stairs             Wheelchair Mobility     Modified Rankin (Stroke Patients Only)       Balance                                            Cognition Arousal/Alertness: Awake/alert Behavior During Therapy: WFL for tasks assessed/performed Overall Cognitive Status: Within Functional Limits for tasks assessed                                        Exercises      General Comments        Pertinent Vitals/Pain Pain Assessment: 0-10 Pain Score: 10-Worst pain ever Pain Location: R knee and thigh Pain Descriptors / Indicators: Aching;Sore Pain Intervention(s): Monitored during session;Repositioned;Patient requesting pain meds-RN notified;Ice applied    Home Living                      Prior Function            PT Goals (current goals can now be found in the care plan section)      Frequency    7X/week      PT Plan Current plan remains appropriate    Co-evaluation              AM-PAC PT "6 Clicks" Daily Activity  Outcome Measure  Difficulty turning over in bed (including adjusting bedclothes, sheets and blankets)?: A Lot Difficulty moving from lying on back to sitting on the side of the bed? : A Lot Difficulty sitting down on and standing up from a chair with arms (e.g., wheelchair, bedside commode, etc,.)?: A Lot Help needed moving to and from a bed to chair (including a wheelchair)?: A Lot Help needed walking in hospital room?: A Lot Help needed climbing 3-5 steps with a railing? : A Lot 6 Click Score: 12    End of Session Equipment Utilized During Treatment: Right knee immobilizer Activity Tolerance: Patient limited by pain Patient left: in bed;with call bell/phone within reach;with family/visitor present Nurse Communication: Patient requests pain meds PT Visit Diagnosis: Difficulty in walking, not elsewhere classified (R26.2)     Time: 1345-1410 PT Time Calculation (min) (ACUTE ONLY): 25 min  Charges:  $Gait Training: 8-22  mins $Therapeutic Activity: 8-22 mins                    G Codes:       Rica Koyanagi  PTA [facility]  Acute  Rehab Pager      [phone]

## 2017-11-05 LAB — CBC
HCT: 41.2 % (ref 39.0–52.0)
Hemoglobin: 13.6 g/dL (ref 13.0–17.0)
MCH: 31.3 pg (ref 26.0–34.0)
MCHC: 33 g/dL (ref 30.0–36.0)
MCV: 94.9 fL (ref 78.0–100.0)
PLATELETS: 225 10*3/uL (ref 150–400)
RBC: 4.34 MIL/uL (ref 4.22–5.81)
RDW: 13.5 % (ref 11.5–15.5)
WBC: 11.5 10*3/uL — AB (ref 4.0–10.5)

## 2017-11-05 LAB — GLUCOSE, CAPILLARY: Glucose-Capillary: 123 mg/dL — ABNORMAL HIGH (ref 65–99)

## 2017-11-05 MED ORDER — SODIUM CHLORIDE 0.9 % IV BOLUS: 500.0000 mL | Freq: Once | INTRAVENOUS | Status: DC | PRN

## 2017-11-05 NOTE — Progress Notes (Signed)
B.Dixon PA notified of events this AM.

## 2017-11-05 NOTE — Progress Notes (Signed)
Patient ambulating to RR with RW when he c/o of "feeling dizzy". Patient able to ambulate back to bed, and sit. Once patient back in bed, patient lost consciousness during BP check. Rapid response initiated. Rapid response nurse Han currently in room with patient.

## 2017-11-05 NOTE — Progress Notes (Signed)
Subjective: 2 Days Post-Op Procedure(s) (LRB): RIGHT TOTAL KNEE ARTHROPLASTY (Right) Patient reports pain as moderate and severe.   Syncopal episode this AM. Recalls getting up to use the bathroom and feeling lightheaded, sat back down, apparently did have LOC during vitals check. He was seated and did not have any type of fall. Reports knee pain. Does not feel dizzy or lightheaded now, no other c/o other than feeling tired.  Objective: Vital signs in last 24 hours: Temp:  [97.9 F (36.6 C)-101.1 F (38.4 C)] 98.4 F (36.9 C) (05/03 0517) Pulse Rate:  [73-95] 86 (05/03 0600) Resp:  [16-19] 17 (05/03 0517) BP: (95-140)/(60-75) 108/69 (05/03 0600) SpO2:  [88 %-100 %] 96 % (05/03 0600)  Intake/Output from previous day: 05/02 0701 - 05/03 0700 In: 1820 [P.O.:1320; IV Piggyback:500] Out: 2100 [Urine:2100] Intake/Output this shift: No intake/output data recorded.  Recent Labs    11/04/17 0543 11/05/17 0606  HGB 13.9 13.6   Recent Labs    11/04/17 0543 11/05/17 0606  WBC 14.3* 11.5*  RBC 4.47 4.34  HCT 42.5 41.2  PLT 251 225   Recent Labs    11/04/17 0543  NA 140  K 4.7  CL 104  CO2 27  BUN 10  CREATININE 0.76  GLUCOSE 125*  CALCIUM 8.7*   No results for input(s): LABPT, INR in the last 72 hours.  Neurologically intact ABD soft Neurovascular intact Sensation intact distally Intact pulses distally Dorsiflexion/Plantar flexion intact Incision: dressing C/D/I and no drainage No cellulitis present Compartment soft no calf pain or sign of DVT. Mod swelling knee. Early ecchymosis medial knee.  Anticipated LOS equal to or greater than 2 midnights due to - Age 92 and older with one or more of the following:  - Obesity  - Expected need for hospital services (PT, OT, Nursing) required for safe  discharge  - Anticipated need for postoperative skilled nursing care or inpatient rehab  - Active co-morbidities: Chronic pain requiring opiods OR   - Unanticipated  findings during/Post Surgery: Slow post-op progression: GI, pain control, mobility    Assessment/Plan: 2 Days Post-Op Procedure(s) (LRB): RIGHT TOTAL KNEE ARTHROPLASTY (Right) Advance diet Up with therapy Rapid response event noted from this AM Bolus received expect improvement following Discussed with Dr. Tonita Cong Anticipate D/C tomorrow as long as he progresses well today with pain control, PT and remains stable with no further syncopal episodes   Janayla Marik M. 11/05/2017, 8:13 AM

## 2017-11-05 NOTE — Progress Notes (Signed)
Patient alert, oriented x4, answering questions appropriately. Receiving a 556mL bolus.

## 2017-11-05 NOTE — Progress Notes (Signed)
Physical Therapy Treatment Patient Details Name: Darren Woods MRN: [id number] DOB: 1971-01-13 Today's Date: 11/05/2017    History of Present Illness Pt s/p R TKR and with hx of back surgery x2    PT Comments    POD # 2 am session Pt had a syncope episode early this morning.  Applied KI and instructed on use, assisted OOB to amb in hallway then performed TKR TE's followed by ICE.  Pt plans to D/C to home tomorrow.    Follow Up Recommendations  Home health PT     Equipment Recommendations  Rolling walker with 5" wheels    Recommendations for Other Services       Precautions / Restrictions Precautions Precautions: Knee;Fall Precaution Comments: instructed on KI use for amb and stairs until able SLR Required Braces or Orthoses: Knee Immobilizer - Right Knee Immobilizer - Right: Discontinue once straight leg raise with < 10 degree lag Restrictions Weight Bearing Restrictions: No Other Position/Activity Restrictions: WBAT    Mobility  Bed Mobility Overal bed mobility: Needs Assistance Bed Mobility: Supine to Sit;Sit to Supine     Supine to sit: Min assist Sit to supine: Min assist   General bed mobility comments: assist R LE off and back onto bed  Transfers Overall transfer level: Needs assistance Equipment used: Rolling walker (2 wheeled) Transfers: Sit to/from Stand Sit to Stand: Min guard;Supervision         General transfer comment: cues for LE management and use of UEs to self assist  Ambulation/Gait Ambulation/Gait assistance: Min guard Ambulation Distance (Feet): 85 Feet Assistive device: Rolling walker (2 wheeled) Gait Pattern/deviations: Step-to pattern;Shuffle;Trunk flexed Gait velocity: decr   General Gait Details: cues for upright posture, position from RW and safety with turns   Stairs             Wheelchair Mobility    Modified Rankin (Stroke Patients Only)       Balance                                             Cognition Arousal/Alertness: Awake/alert Behavior During Therapy: WFL for tasks assessed/performed Overall Cognitive Status: Within Functional Limits for tasks assessed                                        Exercises   Total Knee Replacement TE's 10 reps B LE ankle pumps 10 reps towel squeezes 10 reps knee presses 10 reps heel slides  10 reps SAQ's 10 reps SLR's 10 reps ABD Followed by ICE    General Comments        Pertinent Vitals/Pain Pain Assessment: 0-10 Pain Score: 4  Pain Location: R knee and thigh Pain Descriptors / Indicators: Aching;Sore Pain Intervention(s): Monitored during session;Patient requesting pain meds-RN notified;Ice applied    Home Living                      Prior Function            PT Goals (current goals can now be found in the care plan section) Progress towards PT goals: Progressing toward goals    Frequency    7X/week      PT Plan Current plan remains appropriate    Co-evaluation  AM-PAC PT "6 Clicks" Daily Activity  Outcome Measure  Difficulty turning over in bed (including adjusting bedclothes, sheets and blankets)?: A Lot Difficulty moving from lying on back to sitting on the side of the bed? : A Lot Difficulty sitting down on and standing up from a chair with arms (e.g., wheelchair, bedside commode, etc,.)?: A Lot Help needed moving to and from a bed to chair (including a wheelchair)?: A Lot Help needed walking in hospital room?: A Lot Help needed climbing 3-5 steps with a railing? : A Lot 6 Click Score: 12    End of Session Equipment Utilized During Treatment: Right knee immobilizer Activity Tolerance: Patient tolerated treatment well Patient left: in bed;with call bell/phone within reach;with family/visitor present Nurse Communication: Patient requests pain meds PT Visit Diagnosis: Difficulty in walking, not elsewhere classified (R26.2)     Time:  6270-3500 PT Time Calculation (min) (ACUTE ONLY): 28 min  Charges:  $Gait Training: 8-22 mins $Therapeutic Activity: 8-22 mins                    G Codes:       Rica Koyanagi  PTA [facility]  Acute  Rehab Pager      [phone]

## 2017-11-05 NOTE — Progress Notes (Signed)
Physical Therapy Treatment Patient Details Name: Darren Woods MRN: [id number] DOB: 10-Oct-1970 Today's Date: 11/05/2017    History of Present Illness Pt s/p R TKR and with hx of back surgery x2    PT Comments    POD # 2 pm session Spouse present to observe assist level with bed mobility, transfers and amb.  Also practiced stairs up backward due to no rails.  Pt was able to perform without KI.  Returned to room and performed all supine TKR TE's following handout HEP.  Instructed on proper tech, freq as well as use of ICE.   Pt plans to D/C to home tomorrow  Follow Up Recommendations  Home health PT     Equipment Recommendations  Rolling walker with 5" wheels    Recommendations for Other Services       Precautions / Restrictions Precautions Precautions: Knee;Fall Precaution Comments: instructed on KI use for amb and stairs until able SLR Required Braces or Orthoses: Knee Immobilizer - Right Knee Immobilizer - Right: Discontinue once straight leg raise with < 10 degree lag Restrictions Weight Bearing Restrictions: No Other Position/Activity Restrictions: WBAT    Mobility  Bed Mobility Overal bed mobility: Needs Assistance Bed Mobility: Supine to Sit;Sit to Supine     Supine to sit: Min assist Sit to supine: Min assist   General bed mobility comments: assist R LE off and back onto bed  Transfers Overall transfer level: Needs assistance Equipment used: Rolling walker (2 wheeled) Transfers: Sit to/from Stand Sit to Stand: Min guard;Supervision         General transfer comment: cues for LE management and use of UEs to self assist  Ambulation/Gait Ambulation/Gait assistance: Min guard Ambulation Distance (Feet): 85 Feet Assistive device: Rolling walker (2 wheeled) Gait Pattern/deviations: Step-to pattern;Shuffle;Trunk flexed Gait velocity: decr   General Gait Details: cues for upright posture, position from RW and safety with turns   Stairs    up  backward 2 steps MinAssist with spouse "hands on" instruction on proper tech, walker placement and sequencing.  Handout also given.          Wheelchair Mobility    Modified Rankin (Stroke Patients Only)       Balance                                            Cognition Arousal/Alertness: Awake/alert Behavior During Therapy: WFL for tasks assessed/performed Overall Cognitive Status: Within Functional Limits for tasks assessed                                        Exercises   Total Knee Replacement TE's 10 reps B LE ankle pumps 10 reps towel squeezes 10 reps knee presses 10 reps heel slides  10 reps SAQ's 10 reps SLR's 10 reps ABD Followed by ICE Handout also given     General Comments        Pertinent Vitals/Pain Pain Assessment: 0-10 Pain Score: 4  Pain Location: R knee and thigh Pain Descriptors / Indicators: Aching;Sore Pain Intervention(s): Monitored during session;Patient requesting pain meds-RN notified;Ice applied    Home Living                      Prior Function  PT Goals (current goals can now be found in the care plan section) Progress towards PT goals: Progressing toward goals    Frequency    7X/week      PT Plan Current plan remains appropriate    Co-evaluation              AM-PAC PT "6 Clicks" Daily Activity  Outcome Measure  Difficulty turning over in bed (including adjusting bedclothes, sheets and blankets)?: A Lot Difficulty moving from lying on back to sitting on the side of the bed? : A Lot Difficulty sitting down on and standing up from a chair with arms (e.g., wheelchair, bedside commode, etc,.)?: A Lot Help needed moving to and from a bed to chair (including a wheelchair)?: A Lot Help needed walking in hospital room?: A Lot Help needed climbing 3-5 steps with a railing? : A Lot 6 Click Score: 12    End of Session Equipment Utilized During Treatment: Right  knee immobilizer Activity Tolerance: Patient tolerated treatment well Patient left: in bed;with call bell/phone within reach;with family/visitor present Nurse Communication: Patient requests pain meds PT Visit Diagnosis: Difficulty in walking, not elsewhere classified (R26.2)     Time: 1510-1550 PT Time Calculation (min) (ACUTE ONLY): 40 min  Charges:  $Gait Training: 8-22 mins $Therapeutic Exercise: 8-22 mins $Therapeutic Activity: 8-22 mins                    G Codes:       Rica Koyanagi  PTA [facility]  Acute  Rehab Pager      [phone]

## 2017-11-06 LAB — CBC
HEMATOCRIT: 41 % (ref 39.0–52.0)
Hemoglobin: 13.1 g/dL (ref 13.0–17.0)
MCH: 30.8 pg (ref 26.0–34.0)
MCHC: 32 g/dL (ref 30.0–36.0)
MCV: 96.2 fL (ref 78.0–100.0)
Platelets: 234 10*3/uL (ref 150–400)
RBC: 4.26 MIL/uL (ref 4.22–5.81)
RDW: 13.7 % (ref 11.5–15.5)
WBC: 14.1 10*3/uL — AB (ref 4.0–10.5)

## 2017-11-06 IMAGING — DX DG LUMBAR SPINE 2-3V
2 series · 2 of 2 positions shown · non-contrast
Comparison: December 01, 2013.

CLINICAL DATA: Preoperative evaluation.  Long-term lumbago

EXAM:
LUMBAR SPINE - 2-3 VIEW

[l-spine lat]
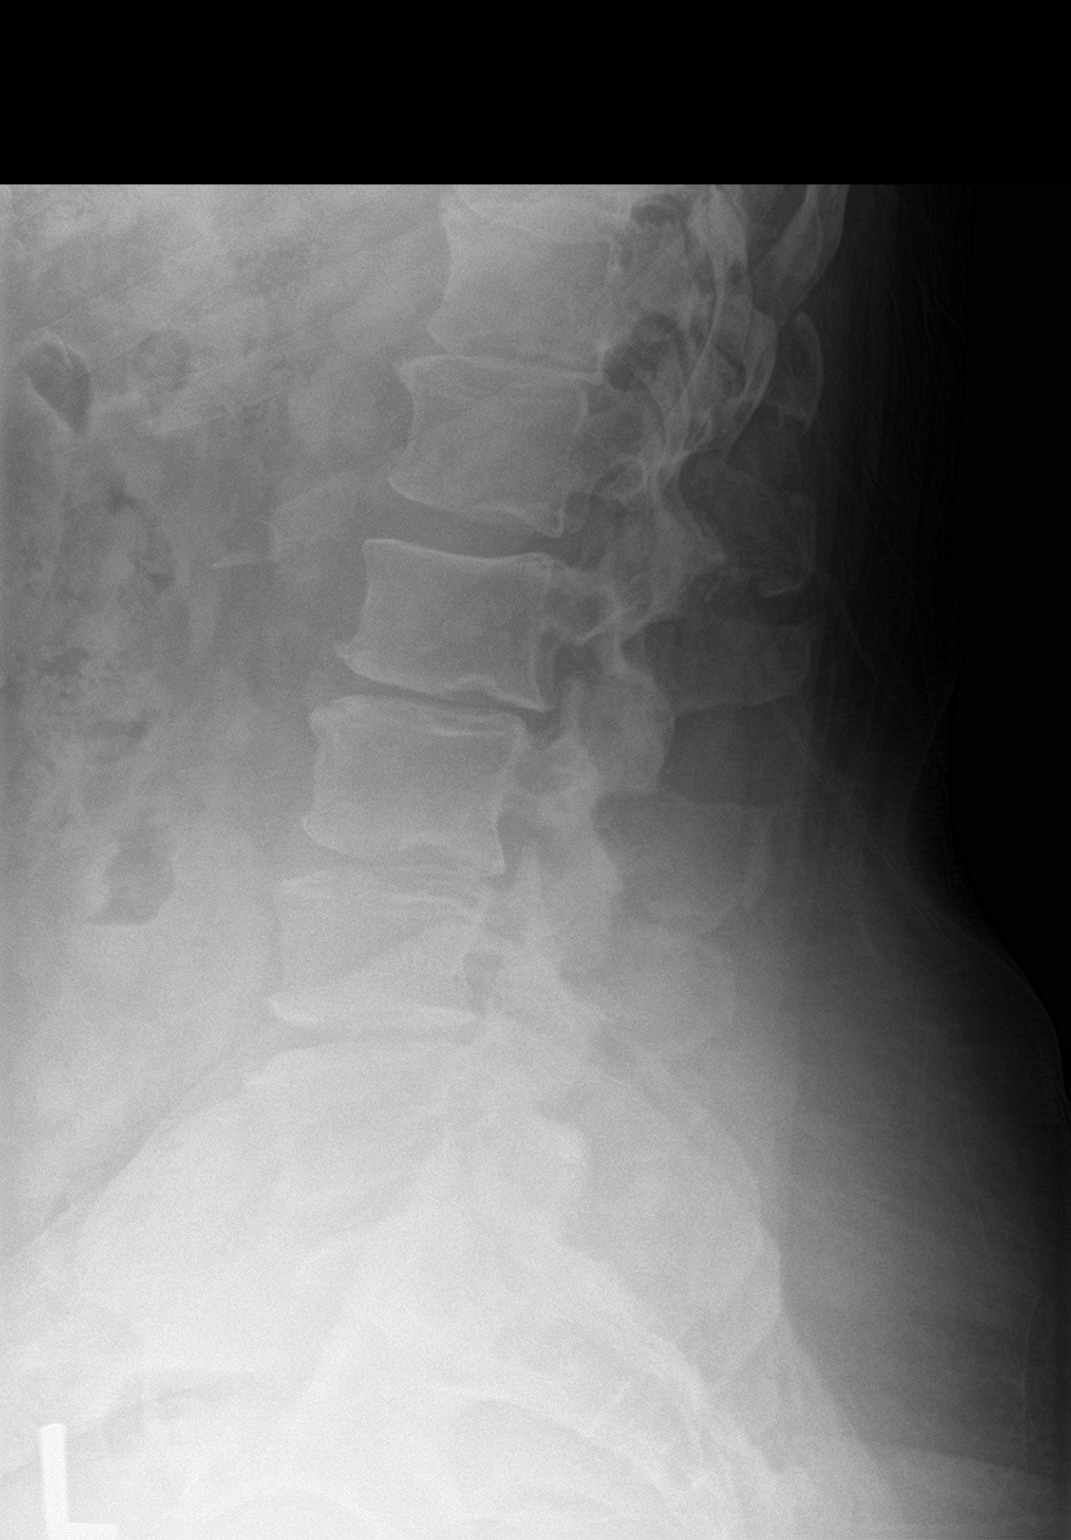

[l-spine ap]
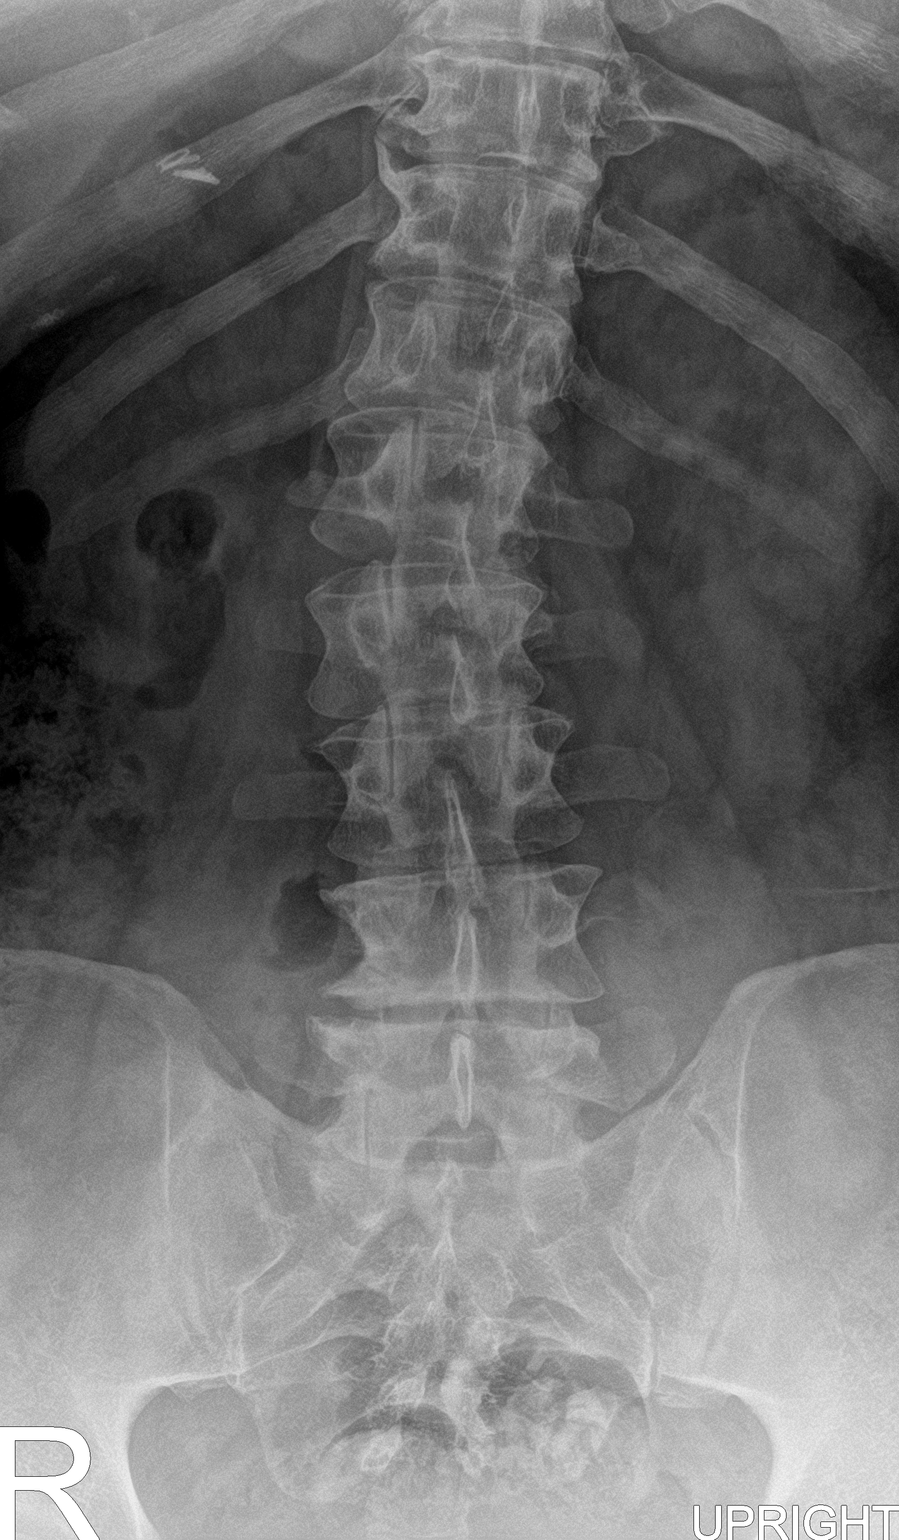

[2 of 2 positions shown; findings below may reference images not displayed]

FINDINGS: Upright frontal and upright lateral views obtained. The there are 5
non-rib-bearing lumbar type vertebral bodies. There is thoracolumbar
dextroscoliosis. There is no fracture or spondylolisthesis. There is
moderate disc space narrowing at L2-3, L3-4, L4-5, and L5-S1. There
is also moderate narrowing of the disc at T12-L1. There are small
anterior osteophytes at all levels. No erosive change.
IMPRESSION: Multilevel disc space narrowing. No fracture or spondylolisthesis.
There is thoracolumbar dextroscoliosis.

## 2017-11-06 NOTE — Progress Notes (Signed)
     Subjective: 3 Days Post-Op Procedure(s) (LRB): RIGHT TOTAL KNEE ARTHROPLASTY (Right)   Patient reports pain as mild, pain controlled. Patient nervous after his syncopal issue incident.  Plan for discharge tomorrow due to pain control and need for inpatient therapy to meet goal of being discharged home safely with family/caregiver.     Objective:   VITALS:   Vitals:   11/06/17 0501 11/06/17 0519  BP:  133/68  Pulse:    Resp: 18 13  Temp: 99.9 F (37.7 C) 99.1 F (37.3 C)  SpO2:  95%    Dorsiflexion/Plantar flexion intact Incision: dressing C/D/I No cellulitis present Compartment soft  LABS Recent Labs    11/04/17 0543 11/05/17 0606 11/06/17 0537  HGB 13.9 13.6 13.1  HCT 42.5 41.2 41.0  WBC 14.3* 11.5* 14.1*  PLT 251 225 234    Recent Labs    11/04/17 0543  NA 140  K 4.7  BUN 10  CREATININE 0.76  GLUCOSE 125*     Assessment/Plan: 3 Days Post-Op Procedure(s) (LRB): RIGHT TOTAL KNEE ARTHROPLASTY (Right) Up with therapy Work with PT to make sure he will be safe for discharge home. Discharge home when ready, probably tomorrow     [place]. Ginamarie Banfield   PAC  11/06/2017, 9:01 AM

## 2017-11-06 NOTE — Progress Notes (Addendum)
Physical Therapy Treatment Patient Details Name: Darren Woods MRN: [id number] DOB: 1971-01-26 Today's Date: 11/06/2017    History of Present Illness Pt s/p R TKR and with hx of back surgery x2    PT Comments    Patient progressing this session with ability to manage his own leg in and out of the bed with improved comfort, increased walking distance and initiating some knee control with there ex.  Noted pt for d/c home tomorrow.  Follow up HHPT and wide 3:1 for home.   Follow Up Recommendations  Home health PT     Equipment Recommendations  Rolling walker with 5" wheels;3in1 (PT)(bariatric 3:1)    Recommendations for Other Services       Precautions / Restrictions Precautions Precautions: Knee;Fall Required Braces or Orthoses: Knee Immobilizer - Right Knee Immobilizer - Right: Discontinue once straight leg raise with < 10 degree lag Restrictions Other Position/Activity Restrictions: WBAT    Mobility  Bed Mobility Overal bed mobility: Needs Assistance Bed Mobility: Supine to Sit     Supine to sit: Supervision;HOB elevated Sit to supine: Supervision   General bed mobility comments: educated how to use L LE to assist R LE as pt reported difficulty with lowering leg off bed  Transfers Overall transfer level: Needs assistance Equipment used: Rolling walker (2 wheeled) Transfers: Sit to/from Stand Sit to Stand: Supervision         General transfer comment: cuse for LE management stood from high bed, sat to low seat simulating car transfer  Ambulation/Gait Ambulation/Gait assistance: Supervision Ambulation Distance (Feet): 120 Feet Assistive device: Rolling walker (2 wheeled) Gait Pattern/deviations: Step-to pattern;Step-through pattern;Decreased stride length;Antalgic;Shuffle     General Gait Details: cues for stepping past L with R, heel strike and toe off, adjusted walker for height   Stairs Stairs: (reports feels comfortable with steps after practice  last sesion)           Wheelchair Mobility    Modified Rankin (Stroke Patients Only)       Balance Overall balance assessment: Needs assistance   Sitting balance-Leahy Scale: Good       Standing balance-Leahy Scale: Fair Standing balance comment: stood at sink to brush teeth                            Cognition Arousal/Alertness: Awake/alert Behavior During Therapy: WFL for tasks assessed/performed Overall Cognitive Status: Within Functional Limits for tasks assessed                                        Exercises Total Joint Exercises Quad Sets: AROM;Both;10 reps;Supine(with cues for hip internal rotation) Short Arc Quad: AAROM;10 reps;Right;Supine Heel Slides: AAROM;Right;Supine;10 reps Hip ABduction/ADduction: AROM;AAROM;10 reps;Right;Supine Straight Leg Raises: AAROM;10 reps;Right;Supine Goniometric ROM: AAROM R knee -10 to 60    General Comments General comments (skin integrity, edema, etc.): errythema and edema R knee      Pertinent Vitals/Pain Pain Score: 7  Pain Location: R knee and thigh Pain Descriptors / Indicators: Aching;Sore Pain Intervention(s): Repositioned;Monitored during session;Ice applied    Home Living                      Prior Function            PT Goals (current goals can now be found in the care plan section) Progress towards PT  goals: Progressing toward goals    Frequency    7X/week      PT Plan Current plan remains appropriate    Co-evaluation              AM-PAC PT "6 Clicks" Daily Activity  Outcome Measure  Difficulty turning over in bed (including adjusting bedclothes, sheets and blankets)?: A Little Difficulty moving from lying on back to sitting on the side of the bed? : A Little Difficulty sitting down on and standing up from a chair with arms (e.g., wheelchair, bedside commode, etc,.)?: A Little Help needed moving to and from a bed to chair (including a  wheelchair)?: A Little Help needed walking in hospital room?: A Little Help needed climbing 3-5 steps with a railing? : A Little 6 Click Score: 18    End of Session Equipment Utilized During Treatment: Gait belt Activity Tolerance: Patient tolerated treatment well Patient left: in bed;with call bell/phone within reach   PT Visit Diagnosis: Difficulty in walking, not elsewhere classified (R26.2)     Time: 1696-7893 PT Time Calculation (min) (ACUTE ONLY): 34 min  Charges:  $Gait Training: 8-22 mins $Therapeutic Exercise: 8-22 mins                    G CodesMagda Kiel, [place] 11/06/2017    Reginia Naas 11/06/2017, 10:49 AM

## 2017-11-07 NOTE — Plan of Care (Signed)
Plan of care discussed.   

## 2017-11-07 NOTE — Progress Notes (Signed)
11/06/2017 Discharge Planning:  Contacted [facility] for [facility] for home. Please see previous NCM notes. HH arranged with [facility]. Jonnie Finner RN CCM Case Mgmt phone [phone]

## 2017-11-07 NOTE — Progress Notes (Signed)
Provided discharge instructions to patient and spouse.  Discussed home care and safety, pain management, wound care, and constipation prevention.  Both verbalized understanding; no further questions/concerns.  Pt discharged to home with all belongings.

## 2017-11-07 NOTE — Progress Notes (Signed)
Physical Therapy Treatment Patient Details Name: Darren Woods MRN: [id number] DOB: 21-May-1971 Today's Date: 11/07/2017    History of Present Illness Pt s/p R TKR and with hx of back surgery x2    PT Comments    Pt has met PT goals and is ready to DC home from PT standpoint. He ambulated 220' with RW, no loss of balance. Reviewed HEP, pt demonstrates good understanding.   Follow Up Recommendations  Home health PT     Equipment Recommendations  Rolling walker with 5" wheels;3in1 (PT)(bariatric 3:1)    Recommendations for Other Services       Precautions / Restrictions Precautions Precautions: Knee;Fall Required Braces or Orthoses: Knee Immobilizer - Right Knee Immobilizer - Right: Discontinue once straight leg raise with < 10 degree lag Restrictions Other Position/Activity Restrictions: WBAT    Mobility  Bed Mobility Overal bed mobility: Needs Assistance Bed Mobility: Supine to Sit     Supine to sit: HOB elevated;Modified independent (Device/Increase time)     General bed mobility comments: self assisted RLE with LLE  Transfers Overall transfer level: Modified independent Equipment used: Rolling walker (2 wheeled) Transfers: Sit to/from Stand Sit to Stand: Modified independent (Device/Increase time)         General transfer comment: good hand placement  Ambulation/Gait Ambulation/Gait assistance: Supervision Ambulation Distance (Feet): 220 Feet Assistive device: Rolling walker (2 wheeled) Gait Pattern/deviations: Step-through pattern;Decreased stride length;Antalgic Gait velocity: decr   General Gait Details: VCs to increase R knee flexion during swing phase, steady with no loss of balance   Stairs             Wheelchair Mobility    Modified Rankin (Stroke Patients Only)       Balance Overall balance assessment: Needs assistance   Sitting balance-Leahy Scale: Good       Standing balance-Leahy Scale: Fair Standing balance  comment: stood at sink to brush teeth                            Cognition Arousal/Alertness: Awake/alert Behavior During Therapy: WFL for tasks assessed/performed Overall Cognitive Status: Within Functional Limits for tasks assessed                                        Exercises Total Joint Exercises Ankle Circles/Pumps: AROM;15 reps;Supine;Both Quad Sets: AROM;10 reps;Supine;Right(with cues for hip internal rotation) Short Arc Quad: AAROM;10 reps;Right;Supine Heel Slides: AAROM;Right;Supine;10 reps Hip ABduction/ADduction: AROM;AAROM;10 reps;Right;Supine Straight Leg Raises: AAROM;10 reps;Right;Supine Long Arc Quad: AAROM;Right;10 reps;Seated Knee Flexion: AAROM;Right;10 reps;Seated Goniometric ROM: -10 to 60* AAROM R knee    General Comments        Pertinent Vitals/Pain Pain Score: 4  Pain Location: R knee and thigh Pain Descriptors / Indicators: Aching;Sore Pain Intervention(s): Limited activity within patient's tolerance;Monitored during session;Premedicated before session;Ice applied    Home Living                      Prior Function            PT Goals (current goals can now be found in the care plan section) Acute Rehab PT Goals Patient Stated Goal: to walk without knee buckling PT Goal Formulation: With patient Time For Goal Achievement: 11/10/17 Potential to Achieve Goals: Good Progress towards PT goals: Goals met/education completed, patient discharged from PT    Frequency  7X/week      PT Plan Current plan remains appropriate    Co-evaluation              AM-PAC PT "6 Clicks" Daily Activity  Outcome Measure  Difficulty turning over in bed (including adjusting bedclothes, sheets and blankets)?: None Difficulty moving from lying on back to sitting on the side of the bed? : A Little Difficulty sitting down on and standing up from a chair with arms (e.g., wheelchair, bedside commode, etc,.)?: A  Little Help needed moving to and from a bed to chair (including a wheelchair)?: None Help needed walking in hospital room?: None Help needed climbing 3-5 steps with a railing? : A Little 6 Click Score: 21    End of Session Equipment Utilized During Treatment: Gait belt Activity Tolerance: Patient tolerated treatment well Patient left: with call bell/phone within reach;in chair Nurse Communication: Mobility status PT Visit Diagnosis: Difficulty in walking, not elsewhere classified (R26.2)     Time: 3643-8377 PT Time Calculation (min) (ACUTE ONLY): 25 min  Charges:  $Gait Training: 8-22 mins $Therapeutic Exercise: 8-22 mins                    G Codes:          Philomena Doheny 11/07/2017, 7:51 AM [phone]

## 2017-11-07 NOTE — Progress Notes (Signed)
   Subjective: 4 Days Post-Op Procedure(s) (LRB): RIGHT TOTAL KNEE ARTHROPLASTY (Right) Patient reports pain as mild.    Plan is to go Home after hospital stay.  Objective: Vital signs in last 24 hours: Temp:  [97.8 F (36.6 C)-98.4 F (36.9 C)] 97.8 F (36.6 C) (05/05 6579) Pulse Rate:  [76-96] 76 (05/05 0632) Resp:  [14-16] 16 (05/05 0632) BP: (112-124)/(64-73) 112/73 (05/05 0383) SpO2:  [94 %-99 %] 98 % (05/05 3383)  Intake/Output from previous day:  Intake/Output Summary (Last 24 hours) at 11/07/2017 0713 Last data filed at 11/07/2017 0600 Gross per 24 hour  Intake 2160 ml  Output 1600 ml  Net 560 ml    Intake/Output this shift: No intake/output data recorded.  Labs: Recent Labs    11/05/17 0606 11/06/17 0537  HGB 13.6 13.1   Recent Labs    11/05/17 0606 11/06/17 0537  WBC 11.5* 14.1*  RBC 4.34 4.26  HCT 41.2 41.0  PLT 225 234   No results for input(s): NA, K, CL, CO2, BUN, CREATININE, GLUCOSE, CALCIUM in the last 72 hours. No results for input(s): LABPT, INR in the last 72 hours.  EXAM General - Patient is Alert, Appropriate and Oriented Extremity - Neurologically intact Neurovascular intact No cellulitis present Compartment soft Dressing - dressing C/D/I Motor Function - intact, moving foot and toes well on exam.    Past Medical History:  Diagnosis Date  . Anxiety   . Arthritis   . GERD (gastroesophageal reflux disease)    occasional  . Obesity   . PONV (postoperative nausea and vomiting)    with first knee surgery, no problems since this procedure    Assessment/Plan: 4 Days Post-Op Procedure(s) (LRB): RIGHT TOTAL KNEE ARTHROPLASTY (Right) Principal Problem:   Primary osteoarthritis of right knee   Discharge home with home health  Weight-Bearing as tolerated to right leg   Gaynelle Arabian 11/07/2017, 7:13 AM

## 2017-11-10 NOTE — Addendum Note (Signed)
Addendum  created 11/10/17 1404 by Myrtie Soman, MD   Intraprocedure Blocks edited, Sign clinical note

## 2017-11-11 IMAGING — DX DG SPINE 1V PORT
1 series · 1 of 1 positions shown · non-contrast
Comparison: None.

CLINICAL DATA: L5-S1 lumbar decompression

EXAM:
PORTABLE SPINE - 1 VIEW

[l-spine lat]
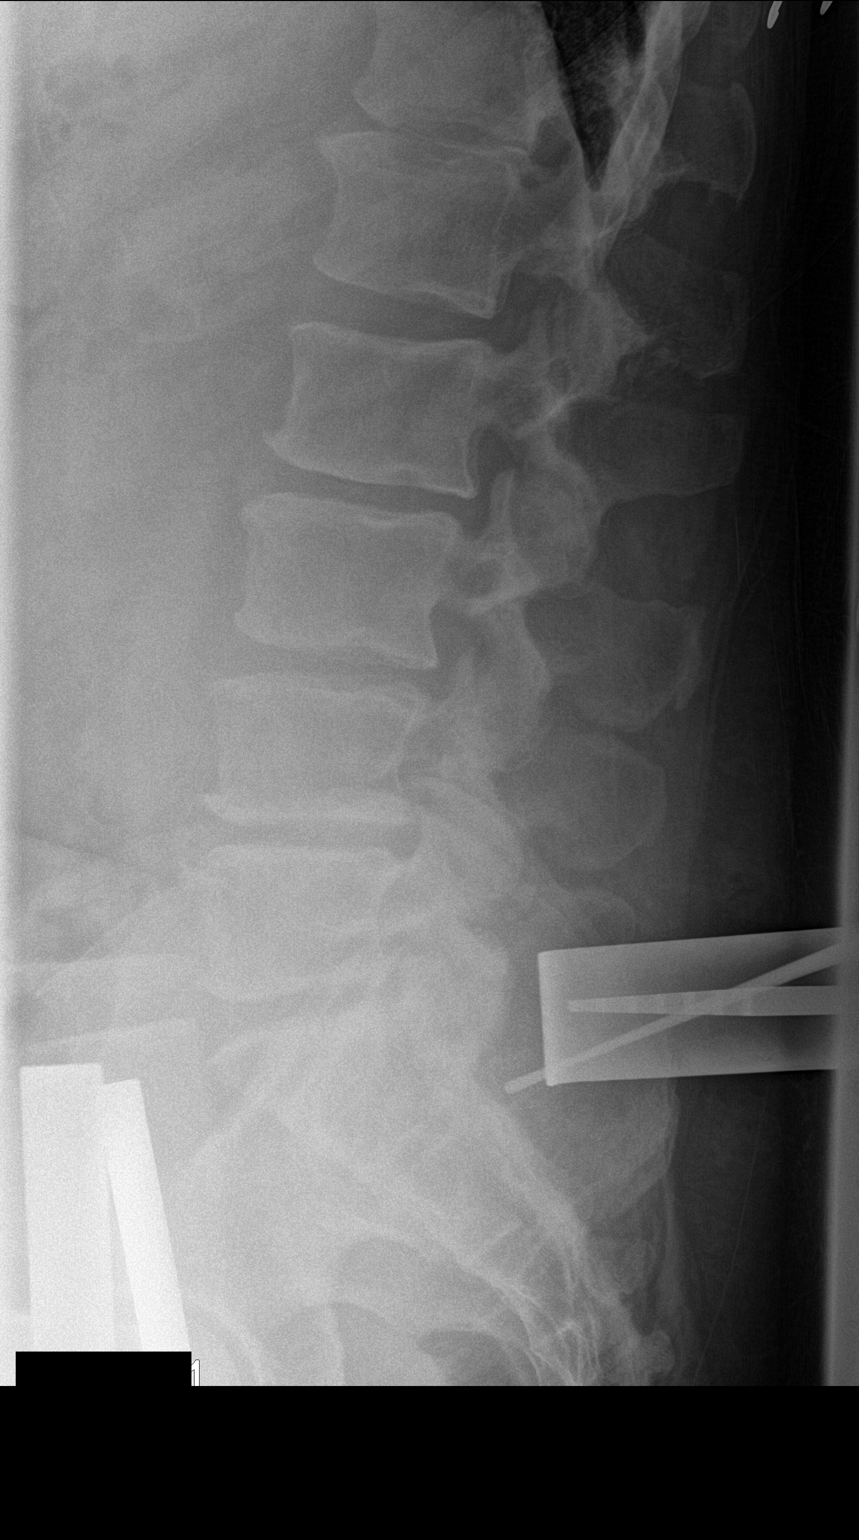

[1 of 1 positions shown; findings below may reference images not displayed]

FINDINGS: Single lateral x-ray of the lumbar spine. Posterior tissue
retractors. Metallic probe with the tip projecting just posterior to
S1-S2. Metallic clamp tip projects over the inferior margin of the
L5 spinous process.
IMPRESSION: Intraoperative localization as described above.
